# Patient Record
Sex: Male | Born: 1975 | Hispanic: No | Marital: Single | Smoking: Never smoker
Health system: Southern US, Community
[De-identification: ages and names within clinical notes are randomized; demographics above are authoritative.]

## PROBLEM LIST (undated history)

## (undated) DIAGNOSIS — G709 Myoneural disorder, unspecified: Secondary | ICD-10-CM

## (undated) DIAGNOSIS — F4310 Post-traumatic stress disorder, unspecified: Secondary | ICD-10-CM

## (undated) DIAGNOSIS — R519 Headache, unspecified: Secondary | ICD-10-CM

## (undated) DIAGNOSIS — F32A Depression, unspecified: Secondary | ICD-10-CM

## (undated) DIAGNOSIS — M259 Joint disorder, unspecified: Secondary | ICD-10-CM

## (undated) DIAGNOSIS — I499 Cardiac arrhythmia, unspecified: Secondary | ICD-10-CM

## (undated) DIAGNOSIS — F329 Major depressive disorder, single episode, unspecified: Secondary | ICD-10-CM

## (undated) DIAGNOSIS — G4700 Insomnia, unspecified: Secondary | ICD-10-CM

## (undated) DIAGNOSIS — R51 Headache: Secondary | ICD-10-CM

## (undated) DIAGNOSIS — F419 Anxiety disorder, unspecified: Secondary | ICD-10-CM

## (undated) HISTORY — DX: Major depressive disorder, single episode, unspecified: F32.9

## (undated) HISTORY — PX: NO PAST SURGERIES: SHX2092

## (undated) HISTORY — DX: Depression, unspecified: F32.A

## (undated) HISTORY — DX: Post-traumatic stress disorder, unspecified: F43.10

## (undated) HISTORY — DX: Cardiac arrhythmia, unspecified: I49.9

## (undated) HISTORY — DX: Anxiety disorder, unspecified: F41.9

## (undated) HISTORY — DX: Insomnia, unspecified: G47.00

## (undated) HISTORY — DX: Myoneural disorder, unspecified: G70.9

## (undated) HISTORY — DX: Joint disorder, unspecified: M25.9

## (undated) HISTORY — DX: Headache, unspecified: R51.9

## (undated) HISTORY — DX: Headache: R51

---

## 2011-07-05 ENCOUNTER — Ambulatory Visit: Payer: PRIVATE HEALTH INSURANCE | Admitting: Family Medicine

## 2011-07-05 VITALS — BP 105/71 | HR 78 | Temp 98.3°F | Resp 16 | Ht 70.0 in | Wt 217.8 lb

## 2011-07-05 DIAGNOSIS — H60399 Other infective otitis externa, unspecified ear: Secondary | ICD-10-CM

## 2011-07-05 DIAGNOSIS — H6092 Unspecified otitis externa, left ear: Secondary | ICD-10-CM

## 2011-07-05 MED ORDER — AMOXICILLIN 875 MG PO TABS: 875.0000 mg | ORAL_TABLET | Freq: Two times a day (BID) | ORAL | Status: AC

## 2011-07-05 MED ORDER — OFLOXACIN 0.3 % OT SOLN: OTIC | Status: DC

## 2011-07-05 NOTE — Progress Notes (Signed)
Subjective: Patient feels like a bug crawled in his ear last night. He is enjoying his gout.  Objective: Right TM is normal. Left TM is red on the anterior aspect of the drum anterior aspect of the canal. On the inferior aspect and posterior aspect of the canal more proximally there is a lot of erythema. There is a little black line in it it may be a rim of bug  Assessment: Otitis externa, possibly caused by having had a bug in ear canal but is no longer there.  Plan: Antibiotics and antibiotic ear drops. Return if not doing better.

## 2011-07-05 NOTE — Patient Instructions (Signed)
Note is present. Take the oral medications and use the drops.  Otitis Externa Otitis externa ("swimmer's ear") is a germ (bacterial) or fungal infection of the outer ear canal (from the eardrum to the outside of the ear). Swimming in dirty water may cause swimmer's ear. It also may be caused by moisture in the ear from water remaining after swimming or bathing. Often the first signs of infection may be itching in the ear canal. This may progress to ear canal swelling, redness, and pus drainage, which may be signs of infection. HOME CARE INSTRUCTIONS   Apply the antibiotic drops to the ear canal as prescribed by your doctor.   This can be a very painful medical condition. A strong pain reliever may be prescribed.   Only take over-the-counter or prescription medicines for pain, discomfort, or fever as directed by your caregiver.   If your caregiver has given you a follow-up appointment, it is very important to keep that appointment. Not keeping the appointment could result in a chronic or permanent injury, pain, hearing loss and disability. If there is any problem keeping the appointment, you must call back to this facility for assistance.  PREVENTION   It is important to keep your ear dry. Use the corner of a towel to wick water out of the ear canal after swimming or bathing.   Avoid scratching in your ear. This can damage the ear canal or remove the protective wax lining the canal and make it easier for germs (bacteria) or a fungus to grow.   You may use ear drops made of rubbing alcohol and vinegar after swimming to prevent future "swimmer's ear" infections. Make up a small bottle of equal parts white vinegar and alcohol. Put 3 or 4 drops into each ear after swimming.   Avoid swimming in lakes, polluted water, or poorly chlorinated pools.  SEEK MEDICAL CARE IF:   An oral temperature above 102 F (38.9 C) develops.   Your ear is still painful after 3 days and shows signs of getting worse  (redness, swelling, pain, or pus).  MAKE SURE YOU:   Understand these instructions.   Will watch your condition.   Will get help right away if you are not doing well or get worse.  Document Released: 04/07/2005 Document Revised: 03/27/2011 Document Reviewed: 11/12/2007 [facility] Patient Information 2012 [place], [place].

## 2012-03-25 ENCOUNTER — Ambulatory Visit: Payer: BC Managed Care – PPO | Admitting: Family Medicine

## 2012-03-25 ENCOUNTER — Ambulatory Visit: Payer: BC Managed Care – PPO

## 2012-03-25 VITALS — BP 105/80 | HR 76 | Temp 98.1°F | Resp 18 | Ht 70.0 in | Wt 227.0 lb

## 2012-03-25 DIAGNOSIS — R042 Hemoptysis: Secondary | ICD-10-CM

## 2012-03-25 DIAGNOSIS — J040 Acute laryngitis: Secondary | ICD-10-CM

## 2012-03-25 DIAGNOSIS — R05 Cough: Secondary | ICD-10-CM

## 2012-03-25 MED ORDER — AZITHROMYCIN 250 MG PO TABS: ORAL_TABLET | ORAL | Status: DC

## 2012-03-25 MED ORDER — MUCINEX DM 30-600 MG PO TB12: ORAL_TABLET | ORAL | Status: DC

## 2012-03-25 MED ORDER — FLUTICASONE PROPIONATE 50 MCG/ACT NA SUSP: 2.0000 | Freq: Every day | NASAL | Status: DC

## 2012-03-25 NOTE — Patient Instructions (Addendum)
1. Cough  DG Chest 2 View, Dextromethorphan-Guaifenesin (MUCINEX DM) 30-600 MG TB12  2. Laryngitis  fluticasone (FLONASE) 50 MCG/ACT nasal spray  3. Hemoptysis     Laryngitis At the top of your windpipe is your voice box. It is the source of your voice. Inside your voice box are 2 bands of muscles called vocal cords. When you breathe, your vocal cords are relaxed and open so that air can get into the lungs. When you decide to say something, these cords come together and vibrate. The sound from these vibrations goes into your throat and comes out through your mouth as sound. Laryngitis is an inflammation of the vocal cords that causes hoarseness, cough, loss of voice, sore throat, and dry throat. Laryngitis can be temporary (acute) or long-term (chronic). Most cases of acute laryngitis improve with time.Chronic laryngitis lasts for more than 3 weeks. CAUSES Laryngitis can often be related to excessive smoking, talking, or yelling, as well as inhalation of toxic fumes and allergies. Acute laryngitis is usually caused by a viral infection, vocal strain, measles or mumps, or bacterial infections. Chronic laryngitis is usually caused by vocal cord strain, vocal cord injury, postnasal drip, growths on the vocal cords, or acid reflux. SYMPTOMS   Cough.  Sore throat.  Dry throat. RISK FACTORS  Respiratory infections.  Exposure to irritating substances, such as cigarette smoke, excessive amounts of alcohol, stomach acids, and workplace chemicals.  Voice trauma, such as vocal cord injury from shouting or speaking too loud. DIAGNOSIS  Your cargiver will perform a physical exam. During the physical exam, your caregiver will examine your throat. The most common sign of laryngitis is hoarseness. Laryngoscopy may be necessary to confirm the diagnosis of this condition. This procedure allows your caregiver to look into the larynx. HOME CARE INSTRUCTIONS  Drink enough fluids to keep your urine clear or  pale yellow.  Rest until you no longer have symptoms or as directed by your caregiver.  Breathe in moist air.  Take all medicine as directed by your caregiver.  Do not smoke.  Talk as little as possible (this includes whispering).  Write on paper instead of talking until your voice is back to normal.  Follow up with your caregiver if your condition has not improved after 10 days. SEEK MEDICAL CARE IF:   You have trouble breathing.  You cough up blood.  You have persistent fever.  You have increasing pain.  You have difficulty swallowing. MAKE SURE YOU:  Understand these instructions.  Will watch your condition.  Will get help right away if you are not doing well or get worse. Document Released: 04/07/2005 Document Revised: 06/30/2011 Document Reviewed: 06/13/2010 [facility] Patient Information 2013 [place], [place].

## 2012-03-25 NOTE — Progress Notes (Signed)
[address]   [place], [place]  [address]   [id number]  Subjective:    Patient ID: Jon Powell, male    DOB: [birth year], 36 y.o.   MRN: [id number]  HPIThis 36 y.o. male presents for evaluation of laryngitis, scratchy throat.  Onset one week ago.  No fever/chills/sweats.  No headache.  No sore throat but +PND causing throat irritation.  No ear pain.  No rhinorrhea; no nasal congestion.  +PND.  +coughing; +sputum production bloody light green; coughed up blood every morning this week.  No SOB.  No v/d.  No medications for symptoms.  Teaches at [employer].  Laryngitis x 4 days.   Review of Systems  Constitutional: Negative for fever, chills, diaphoresis and fatigue.  HENT: Positive for postnasal drip. Negative for ear pain, congestion, sore throat, rhinorrhea, sneezing, drooling, voice change and ear discharge.   Respiratory: Positive for cough. Negative for shortness of breath, wheezing and stridor.   Gastrointestinal: Negative for nausea, vomiting and diarrhea.  Skin: Negative for rash.    No past medical history on file.  No past surgical history on file.  Prior to Admission medications   Not on File    No Known Allergies  History   Social History  . Marital Status: Single    Spouse Name: N/A    Number of Children: N/A  . Years of Education: N/A   Occupational History  . Not on file.   Social History Main Topics  . Smoking status: Never Smoker   . Smokeless tobacco: Not on file  . Alcohol Use: No  . Drug Use: No  . Sexually Active: Yes    Birth Control/ Protection: Condom   Other Topics Concern  . Not on file   Social History Narrative  . No narrative on file    No family history on file.     Objective:   Physical Exam  Nursing note and vitals reviewed. Constitutional: He is oriented to person, place, and time. He appears well-developed and well-nourished. No distress.  HENT:  Head: Normocephalic and atraumatic.  Right Ear: External ear normal.  Left Ear:  External ear normal.  Nose: Nose normal.  Mouth/Throat: Oropharynx is clear and moist and mucous membranes are normal. Uvula swelling present. No oropharyngeal exudate.  Eyes: Conjunctivae normal and EOM are normal. Pupils are equal, round, and reactive to light.  Neck: Normal range of motion. Neck supple. No thyromegaly present.  Cardiovascular: Normal rate, regular rhythm and normal heart sounds.  Exam reveals no gallop and no friction rub.   No murmur heard. Pulmonary/Chest: Effort normal and breath sounds normal. He has no wheezes. He has no rales.  Lymphadenopathy:    He has no cervical adenopathy.  Neurological: He is alert and oriented to person, place, and time.  Skin: Skin is warm and dry. No rash noted. He is not diaphoretic.  Psychiatric: He has a normal mood and affect. His behavior is normal. Judgment and thought content normal.   [facility] reading (PRIMARY) by  Dr. Katrinka Blazing. CXR:  NAD.      Assessment & Plan:   1. Cough  DG Chest 2 View, Dextromethorphan-Guaifenesin (MUCINEX DM) 30-600 MG TB12  2. Laryngitis  fluticasone (FLONASE) 50 MCG/ACT nasal spray  3. Hemoptysis        1.  Laryngitis:  New.  Consistent with viral syndrome. Recommend voice rest, fluids.  Rx for Mucinex DM, Flonase sent to pharmacy. If no improvement in 3-5 days, to fill Zpack for secondary  bacterial infection.   2.  Cough:  New. Associated with laryngitis. Consistent with viral process. Supportive care with Mucinex DM. 3.  Hemoptysis:  New.  CXR negative.  Likely secondary to viral syndrome.  Supportive care with fluids, Mucinex DM, Flonase.  If persists more than next three days, to fill Zithromax.  If persists beyond one week, RTC.  Low risk for other pathology.  Meds ordered this encounter  Medications  . Dextromethorphan-Guaifenesin (MUCINEX DM) 30-600 MG TB12    Sig: One tablet two times daily for cough, congestion    Dispense:  20 each    Refill:  0  . fluticasone (FLONASE) 50 MCG/ACT nasal spray     Sig: Place 2 sprays into the nose daily.    Dispense:  16 g    Refill:  6  . azithromycin (ZITHROMAX Z-PAK) 250 MG tablet    Sig: Two tablets daily x 1 day,then one tablet daily x 4 days    Dispense:  6 each    Refill:  0

## 2013-12-30 ENCOUNTER — Ambulatory Visit (INDEPENDENT_AMBULATORY_CARE_PROVIDER_SITE_OTHER): Admitting: Family Medicine

## 2013-12-30 VITALS — BP 100/70 | HR 83 | Temp 97.8°F | Resp 16 | Ht 70.5 in | Wt 226.0 lb

## 2013-12-30 DIAGNOSIS — L259 Unspecified contact dermatitis, unspecified cause: Secondary | ICD-10-CM

## 2013-12-30 MED ORDER — TRIAMCINOLONE ACETONIDE 0.1 % EX CREA: 1.0000 "application " | TOPICAL_CREAM | Freq: Two times a day (BID) | CUTANEOUS | Status: DC

## 2013-12-30 MED ORDER — PREDNISONE 20 MG PO TABS: ORAL_TABLET | ORAL | Status: DC

## 2013-12-30 NOTE — Patient Instructions (Signed)
Take over-the-counter or Zyrtec (cetirizine) one daily for itching. Take at bedtime.   Take the prednisone after breakfast 3 daily for 2 days, then 2 daily for 2 days, then one daily for 2 days, then one half daily for 4 days  Use the triamcinolone cream twice daily on affected areas  Wash well. Very hot water may cause you to itch more.  Return if further problems

## 2013-12-30 NOTE — Progress Notes (Signed)
Subjective: Patient has a scattered itchy rash which has come up over last weekend. He does a lot outdoors with the [employer]. He also has dogs that he goes outdoors a lot with in the grass. Does not know of any specific poison ivy exposure, but has been in the right count of places that he could have gotten some.  Objective: Scattered rash maculopapular on his upper and lower arms, posterior thigh, and chest wall.  Since: Contact dermatitis, presumably poison ivy he may have gone from his dogs.  Plan: Prednisone, triamcinolone, and Zyrtec  Return if further problems

## 2014-01-14 ENCOUNTER — Ambulatory Visit (INDEPENDENT_AMBULATORY_CARE_PROVIDER_SITE_OTHER): Admitting: Family Medicine

## 2014-01-14 VITALS — BP 116/70 | HR 79 | Temp 97.9°F | Resp 16 | Ht 70.0 in | Wt 228.0 lb

## 2014-01-14 DIAGNOSIS — R21 Rash and other nonspecific skin eruption: Secondary | ICD-10-CM

## 2014-01-14 LAB — POCT SKIN KOH: SKIN KOH, POC: NEGATIVE

## 2014-01-14 NOTE — Progress Notes (Signed)
Subjective: Patient is here for the rash again. It is persisted. He used some Prometrium and someone else gave him. The cortisones did not help. He still itches.  Objective:   Scattered pruritic rash on trunk and extremities. It is in clusters and some linear areas. It turned out that did not respond poison ivy.  Skin scraping was done and is negative  Punch biopsy. done on right abdominal wall. Sterile technique. 1% lidocaine with epinephrine. Specimen sent for pathology. Cautery done of the base of the lesion.

## 2014-01-14 NOTE — Patient Instructions (Signed)
Take some antihistamine for itching if necessary.  If you do not hear from me by Thursday or Friday of next week regarding the results of the biopsy call back and see if they are available yet.

## 2014-01-14 NOTE — Progress Notes (Signed)
   Subjective:    Patient ID: Jon Powell, male    DOB: 1975/11/06, 38 y.o.   MRN: [id number]  HPI    Review of Systems     Objective:   Physical Exam        Assessment & Plan:

## 2014-01-19 ENCOUNTER — Other Ambulatory Visit: Payer: Self-pay | Admitting: Family Medicine

## 2014-01-19 ENCOUNTER — Telehealth: Payer: Self-pay | Admitting: Radiology

## 2014-01-19 DIAGNOSIS — L309 Dermatitis, unspecified: Secondary | ICD-10-CM

## 2014-01-19 MED ORDER — CLOBETASOL PROPIONATE 0.05 % EX CREA: 1.0000 "application " | TOPICAL_CREAM | Freq: Two times a day (BID) | CUTANEOUS | Status: DC

## 2014-01-19 NOTE — Telephone Encounter (Signed)
Pt is calling repeatedly asking about his path results.  i don't think they're back yet, but I wanted to let you know.

## 2014-01-20 NOTE — Telephone Encounter (Signed)
Note sent to lab about this last night

## 2014-01-31 ENCOUNTER — Ambulatory Visit (INDEPENDENT_AMBULATORY_CARE_PROVIDER_SITE_OTHER): Admitting: Physician Assistant

## 2014-01-31 VITALS — BP 112/80 | HR 76 | Temp 98.3°F | Resp 16 | Ht 70.25 in | Wt 230.0 lb

## 2014-01-31 DIAGNOSIS — L309 Dermatitis, unspecified: Secondary | ICD-10-CM

## 2014-01-31 MED ORDER — PREDNISONE 20 MG PO TABS: ORAL_TABLET | ORAL | Status: AC

## 2014-01-31 MED ORDER — CETIRIZINE HCL 10 MG PO TABS: 10.0000 mg | ORAL_TABLET | Freq: Every day | ORAL | Status: DC

## 2014-01-31 MED ORDER — HYDROXYZINE HCL 25 MG PO TABS: 12.5000 mg | ORAL_TABLET | Freq: Four times a day (QID) | ORAL | Status: DC | PRN

## 2014-01-31 MED ORDER — RANITIDINE HCL 150 MG PO TABS: 150.0000 mg | ORAL_TABLET | Freq: Two times a day (BID) | ORAL | Status: DC

## 2014-01-31 NOTE — Progress Notes (Signed)
I was directly involved with the patient's care and agree with the physical, diagnosis and treatment plan.  

## 2014-01-31 NOTE — Patient Instructions (Signed)
Limit showers to once a day Stop using [place] body wash. Start using dove soap. Make sure Detergents are dye free and scent free Moisturize twice a day. Keep taking medications for at least 5 days after your itching stops. Return if symptoms fail to worsen.  Eczema Eczema, also called atopic dermatitis, is a skin disorder that causes inflammation of the skin. It causes a red rash and dry, scaly skin. The skin becomes very itchy. Eczema is generally worse during the cooler winter months and often improves with the warmth of summer. Eczema usually starts showing signs in infancy. Some children outgrow eczema, but it may last through adulthood.  CAUSES  The exact cause of eczema is not known, but it appears to run in families. People with eczema often have a family history of eczema, allergies, asthma, or hay fever. Eczema is not contagious. Flare-ups of the condition may be caused by:   Contact with something you are sensitive or allergic to.   Stress. SIGNS AND SYMPTOMS  Dry, scaly skin.   Red, itchy rash.   Itchiness. This may occur before the skin rash and may be very intense.  DIAGNOSIS  The diagnosis of eczema is usually made based on symptoms and medical history. TREATMENT  Eczema cannot be cured, but symptoms usually can be controlled with treatment and other strategies. A treatment plan might include:  Controlling the itching and scratching.   Use over-the-counter antihistamines as directed for itching. This is especially useful at night when the itching tends to be worse.   Use over-the-counter steroid creams as directed for itching.   Avoid scratching. Scratching makes the rash and itching worse. It may also result in a skin infection (impetigo) due to a break in the skin caused by scratching.   Keeping the skin well moisturized with creams every day. This will seal in moisture and help prevent dryness. Lotions that contain alcohol and water should be  avoided because they can dry the skin.   Limiting exposure to things that you are sensitive or allergic to (allergens).   Recognizing situations that cause stress.   Developing a plan to manage stress.  HOME CARE INSTRUCTIONS   Only take over-the-counter or prescription medicines as directed by your health care provider.   Do not use anything on the skin without checking with your health care provider.   Keep baths or showers short (5 minutes) in warm (not hot) water. Use mild cleansers for bathing. These should be unscented. You may add nonperfumed bath oil to the bath water. It is best to avoid soap and bubble bath.   Immediately after a bath or shower, when the skin is still damp, apply a moisturizing ointment to the entire body. This ointment should be a petroleum ointment. This will seal in moisture and help prevent dryness. The thicker the ointment, the better. These should be unscented.   Keep fingernails cut short. Children with eczema may need to wear soft gloves or mittens at night after applying an ointment.   Dress in clothes made of cotton or cotton blends. Dress lightly, because heat increases itching.   A child with eczema should stay away from anyone with fever blisters or cold sores. The virus that causes fever blisters (herpes simplex) can cause a serious skin infection in children with eczema. SEEK MEDICAL CARE IF:   Your itching interferes with sleep.   Your rash gets worse or is not better within 1 week after starting treatment.   You see  pus or soft yellow scabs in the rash area.   You have a fever.   You have a rash flare-up after contact with someone who has fever blisters.  Document Released: 04/04/2000 Document Revised: 01/26/2013 Document Reviewed: 11/08/2012 [facility] Patient Information 2015 [place]. This information is not intended to replace advice given to you by your health care provider. Make sure you discuss any questions you  have with your health care provider.

## 2014-01-31 NOTE — Progress Notes (Signed)
Subjective:    Patient ID: Jon Powell, male    DOB: 05/12/1975, 38 y.o.   MRN: [id number]  HPI  This is a 38 year old male presenting with 4-5 weeks of a pruritic rash on his chest, abdomen, arms and legs. He has been seen twice already. It was originally thought that he had poison ivy and was prescribed a tapering dose of prednisone. Patient reports this did not help and returned. A spot on his abdomen was biopsied and it was thought he had eczema. Biopsy showed eosinophils and resembled contact dermatitis. At this point he was prescribed topical clobetasol 0.05%. Patient used the clobetasol for 1.5 weeks. He returns today stating that the cream helped the rash on his chest and abdomen but has not improved the rash on his arms and legs. He states the only thing that makes it feel better is scratching off the surface of the lesion. Patient reports he did not have atopy as a child and never had asthma or seasonal allergies. He uses cocoa butter lotion once a day after he showers. He showers 1-2 times per day. He uses [place] body wash. He has not changed detergents.   Review of Systems  Constitutional: Negative.   HENT: Negative.   Eyes: Negative.   Respiratory: Negative.   Skin: Positive for rash.  Allergic/Immunologic: Negative.        Objective:   Physical Exam  Constitutional: He is oriented to person, place, and time. He appears well-developed and well-nourished. No distress.  HENT:  Head: Normocephalic and atraumatic.  Right Ear: Hearing normal.  Left Ear: Hearing normal.  Eyes: Lids are normal. Right eye exhibits no discharge. Left eye exhibits no discharge.  Pulmonary/Chest: Effort normal. No respiratory distress.  Neurological: He is alert and oriented to person, place, and time.  Skin: Skin is warm and dry. No rash (lesions on upper arms and chest are flesh colored, raised and scabbed. lesions appear old. ) noted. Rash is not papular. He is not diaphoretic.  Psychiatric:  He has a normal mood and affect. His speech is normal and behavior is normal. Thought content normal.       Assessment & Plan:  1. Dermatitis  Patient likely has a contact dermatitis, as supported by a skin biopsy a few weeks ago. There was no improvement with a prednisone taper and there was only some improvement with the clobetasol cream. At this point, as demonstrated by scabbed old lesions, his rash is most likely caused by continued scratching of the lesions. He will discontinue the clobetasol cream. He was start another prednisone taper as well as atarax, zantac and zyrtec. We discussed limiting showers to once a day and moisturizing twice a day. He will stop using [place] body wash and start using dove soap. We discussed he may need to use a hypoallergenic detergent in the future. He will try all of these things and come back if not improved. If not improved, will refer to dermatology.  - hydrOXYzine (ATARAX/VISTARIL) 25 MG tablet; Take 0.5-1 tablets (12.5-25 mg total) by mouth every 6 (six) hours as needed for itching.  Dispense: 60 tablet; Refill: 0 - predniSONE (DELTASONE) 20 MG tablet; Take 3 PO QAM x3days, 2 PO QAM x3days, 1 PO QAM x3days  Dispense: 18 tablet; Refill: 0 - ranitidine (ZANTAC) 150 MG tablet; Take 1 tablet (150 mg total) by mouth 2 (two) times daily.  Dispense: 60 tablet; Refill: 0 - cetirizine (ZYRTEC) 10 MG tablet; Take 1 tablet (  10 mg total) by mouth daily.  Dispense: 30 tablet; Refill: 11   Nicole V. Dyke BrackettBush, PA-C, M[facility]  01/31/2014

## 2014-02-20 ENCOUNTER — Other Ambulatory Visit: Payer: Self-pay | Admitting: Family Medicine

## 2014-02-21 ENCOUNTER — Other Ambulatory Visit: Payer: Self-pay | Admitting: Family Medicine

## 2014-02-22 NOTE — Telephone Encounter (Signed)
Dr Alwyn RenHopper, I got this request yesterday and denied it bc Joni Reiningicole had DCd use at 01/31/14 OV, but it appears pt is requesting it again. Do you want to RF it, or have pt RTC again?

## 2014-02-23 ENCOUNTER — Ambulatory Visit (INDEPENDENT_AMBULATORY_CARE_PROVIDER_SITE_OTHER): Admitting: Family Medicine

## 2014-02-23 VITALS — BP 100/70 | HR 74 | Temp 98.4°F | Resp 16 | Ht 70.0 in | Wt 229.4 lb

## 2014-02-23 DIAGNOSIS — L0100 Impetigo, unspecified: Secondary | ICD-10-CM

## 2014-02-23 DIAGNOSIS — L309 Dermatitis, unspecified: Secondary | ICD-10-CM

## 2014-02-23 DIAGNOSIS — L011 Impetiginization of other dermatoses: Secondary | ICD-10-CM

## 2014-02-23 MED ORDER — TRIAMCINOLONE ACETONIDE 0.1 % EX CREA: 1.0000 "application " | TOPICAL_CREAM | Freq: Three times a day (TID) | CUTANEOUS | Status: DC

## 2014-02-23 MED ORDER — CLOBETASOL PROPIONATE 0.05 % EX CREA: 1.0000 "application " | TOPICAL_CREAM | Freq: Two times a day (BID) | CUTANEOUS | Status: DC

## 2014-02-23 NOTE — Patient Instructions (Signed)
As soon as the rash resolves w/ clobetasol switch to triamcinolone and Eucerin mixture to keep the rash away.  If you are not having any further problems, you can cancel your dermatology appointment.  Eczema Eczema, also called atopic dermatitis, is a skin disorder that causes inflammation of the skin. It causes a red rash and dry, scaly skin. The skin becomes very itchy. Eczema is generally worse during the cooler winter months and often improves with the warmth of summer. Eczema usually starts showing signs in infancy. Some children outgrow eczema, but it may last through adulthood.  CAUSES  The exact cause of eczema is not known, but it appears to run in families. People with eczema often have a family history of eczema, allergies, asthma, or hay fever. Eczema is not contagious. Flare-ups of the condition may be caused by:   Contact with something you are sensitive or allergic to.   Stress. SIGNS AND SYMPTOMS  Dry, scaly skin.   Red, itchy rash.   Itchiness. This may occur before the skin rash and may be very intense.  DIAGNOSIS  The diagnosis of eczema is usually made based on symptoms and medical history. TREATMENT  Eczema cannot be cured, but symptoms usually can be controlled with treatment and other strategies. A treatment plan might include:  Controlling the itching and scratching.   Use over-the-counter antihistamines as directed for itching. This is especially useful at night when the itching tends to be worse.   Use over-the-counter steroid creams as directed for itching.   Avoid scratching. Scratching makes the rash and itching worse. It may also result in a skin infection (impetigo) due to a break in the skin caused by scratching.   Keeping the skin well moisturized with creams every day. This will seal in moisture and help prevent dryness. Lotions that contain alcohol and water should be avoided because they can dry the skin.   Limiting exposure to things that  you are sensitive or allergic to (allergens).   Recognizing situations that cause stress.   Developing a plan to manage stress.  HOME CARE INSTRUCTIONS   Only take over-the-counter or prescription medicines as directed by your health care provider.   Do not use anything on the skin without checking with your health care provider.   Keep baths or showers short (5 minutes) in warm (not hot) water. Use mild cleansers for bathing. These should be unscented. You may add nonperfumed bath oil to the bath water. It is best to avoid soap and bubble bath.   Immediately after a bath or shower, when the skin is still damp, apply a moisturizing ointment to the entire body. This ointment should be a petroleum ointment. This will seal in moisture and help prevent dryness. The thicker the ointment, the better. These should be unscented.   Keep fingernails cut short. Children with eczema may need to wear soft gloves or mittens at night after applying an ointment.   Dress in clothes made of cotton or cotton blends. Dress lightly, because heat increases itching.   A child with eczema should stay away from anyone with fever blisters or cold sores. The virus that causes fever blisters (herpes simplex) can cause a serious skin infection in children with eczema. SEEK MEDICAL CARE IF:   Your itching interferes with sleep.   Your rash gets worse or is not better within 1 week after starting treatment.   You see pus or soft yellow scabs in the rash area.   You have  a fever.   You have a rash flare-up after contact with someone who has fever blisters.  Document Released: 04/04/2000 Document Revised: 01/26/2013 Document Reviewed: 11/08/2012 [facility] Patient Information 2015 [employer] [place]. This information is not intended to replace advice given to you by your health care provider. Make sure you discuss any questions you have with your health care provider.

## 2014-02-23 NOTE — Progress Notes (Signed)
Subjective:    Patient ID: Jon Powell, male    DOB: 01/18/1976, 38 y.o.   MRN: [id number] This chart was scribed for Norberto SorensonEva Gaye Scorza, MD by Jolene Provostobert Halas, Medical Scribe. This patient was seen in Room 11 and the patient's care was started a 11:58 AM.  Chief Complaint  Patient presents with  . Follow-up    here for follow up on rash    HPI   History reviewed. No pertinent past medical history. Allergies  Allergen Reactions  . Doxycycline Rash   No current outpatient prescriptions on file prior to visit.   No current facility-administered medications on file prior to visit.   HPI Comments: Jon Powell is a 38 y.o. male who [facility] of continuing rash that began two months ago. Pt stopped taking clobetasol two weeks ago after relief of his rash symptoms on his arms bilaterally and posterior thigh, as well as an improvement of his rash on his chest. Five days ago his rash on his chest recurred. Pt has not seen a dermatologist, and would like a recommendation.  Pt has had a rash for two months, and [facility] for the same symptoms. Pt was seen on (12/30/2013) and was prescribed a prednisone taper, triamcinolone cream (TAC) and zyrtec. Pt was then seen two weeks (01/14/2014) later without improvement. Biopsy was taken at this point, which showed eczema. Pt was prescribed clobatizol at that point. Pt's rash on chest and abdomen reponded to th clobatizol , but the rash on his chest expanded. Pt was then seen on 01/31/2014 and was put on another prenisone taper, in addition to atarax and zantac. Pt was also encouraged to change soap and increase skin moisturizer.   Review of Systems  Constitutional: Negative for fever and chills.  HENT: Negative for rhinorrhea.   Skin: Positive for color change and rash. Negative for wound.       Objective:  BP 100/70 mmHg  Pulse 74  Temp(Src) 98.4 F (36.9 C)  Resp 16  Ht 5\' 10"  (1.778 m)  Wt 229 lb  6.4 oz (104.055 kg)  BMI 32.92 kg/m2  SpO2 98%  Physical Exam  Constitutional: He is oriented to person, place, and time. He appears well-developed and well-nourished.  HENT:  Head: Normocephalic and atraumatic.  Eyes: Pupils are equal, round, and reactive to light.  Neck: No JVD present.  Cardiovascular: Normal rate and regular rhythm.   Pulmonary/Chest: Effort normal and breath sounds normal. No respiratory distress.  Neurological: He is alert and oriented to person, place, and time.  Skin: Skin is warm and dry.  2mm hyperpigmented macules with poorly defined serpiginous hyper pigmented macular papular lesions, approximately 2cm.  Psychiatric: He has a normal mood and affect. His behavior is normal.  Nursing note and vitals reviewed.      Assessment & Plan:   Impetiginized atopic dermatitis - Plan: Ambulatory referral to Dermatology  Eczema - Plan: clobetasol cream (TEMOVATE) 0.05 %, Ambulatory referral to Dermatology  Meds ordered this encounter  Medications  . clobetasol cream (TEMOVATE) 0.05 %    Sig: Apply 1 application topically 2 (two) times daily.    Dispense:  60 g    Refill:  1  . triamcinolone cream (KENALOG) 0.1 %    Sig: Apply 1 application topically 3 (three) times daily.    Dispense:  453.6 g    Refill:  5    Mix 1:2 ratio with TAC: Eucerin. Dispense large jar  I personally performed the services described in this documentation, which was scribed in my presence. The recorded information has been reviewed and considered, and addended by me as needed.  Delman Cheadle, MD MPH

## 2014-02-27 NOTE — Telephone Encounter (Signed)
Patient already saw Dr. Clelia CroftShaw.

## 2014-04-11 ENCOUNTER — Ambulatory Visit (INDEPENDENT_AMBULATORY_CARE_PROVIDER_SITE_OTHER)

## 2014-04-11 ENCOUNTER — Ambulatory Visit (INDEPENDENT_AMBULATORY_CARE_PROVIDER_SITE_OTHER): Admitting: Emergency Medicine

## 2014-04-11 VITALS — BP 106/76 | HR 76 | Temp 98.5°F | Resp 18 | Ht 70.5 in | Wt 224.6 lb

## 2014-04-11 DIAGNOSIS — R059 Cough, unspecified: Secondary | ICD-10-CM

## 2014-04-11 DIAGNOSIS — R05 Cough: Secondary | ICD-10-CM

## 2014-04-11 DIAGNOSIS — M542 Cervicalgia: Secondary | ICD-10-CM

## 2014-04-11 DIAGNOSIS — M545 Low back pain, unspecified: Secondary | ICD-10-CM

## 2014-04-11 DIAGNOSIS — M5412 Radiculopathy, cervical region: Secondary | ICD-10-CM

## 2014-04-11 DIAGNOSIS — M62838 Other muscle spasm: Secondary | ICD-10-CM

## 2014-04-11 DIAGNOSIS — G43009 Migraine without aura, not intractable, without status migrainosus: Secondary | ICD-10-CM

## 2014-04-11 DIAGNOSIS — R053 Chronic cough: Secondary | ICD-10-CM

## 2014-04-11 MED ORDER — MELOXICAM 7.5 MG PO TABS: 7.5000 mg | ORAL_TABLET | Freq: Every day | ORAL | Status: DC

## 2014-04-11 MED ORDER — CYCLOBENZAPRINE HCL 10 MG PO TABS: 10.0000 mg | ORAL_TABLET | Freq: Every day | ORAL | Status: DC

## 2014-04-11 MED ORDER — GUAIFENESIN ER 1200 MG PO TB12: 1.0000 | ORAL_TABLET | Freq: Two times a day (BID) | ORAL | Status: DC | PRN

## 2014-04-11 MED ORDER — TOPIRAMATE 50 MG PO TABS: 50.0000 mg | ORAL_TABLET | Freq: Two times a day (BID) | ORAL | Status: DC

## 2014-04-11 MED ORDER — HYDROCODONE-HOMATROPINE 5-1.5 MG/5ML PO SYRP: 5.0000 mL | ORAL_SOLUTION | Freq: Every evening | ORAL | Status: DC | PRN

## 2014-04-11 MED ORDER — SUMATRIPTAN SUCCINATE 50 MG PO TABS: 50.0000 mg | ORAL_TABLET | ORAL | Status: AC | PRN

## 2014-04-11 MED ORDER — BENZONATATE 100 MG PO CAPS: 100.0000 mg | ORAL_CAPSULE | Freq: Three times a day (TID) | ORAL | Status: DC | PRN

## 2014-04-11 NOTE — Patient Instructions (Signed)

## 2014-04-11 NOTE — Progress Notes (Signed)
MRN: [id number] DOB: 07/12/1975  Subjective:   Jon Powell is a 38 y.o. male presenting for respiratory problems, migraines and "degenerative joint disease".  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 Since he returned, has had chronic sinus issues, respiratory problems. Today reports 5 week history of cough intermittently productive with light green mucus, worse at night, wakes him up from sleep intermittent post-tussive emesis. Associated with frontal sinus pain, ear fullness, raw throat, lost his voice a couple of weeks ago (now improved), chest congestion, chest tightness, intermittent shob, intermittent wheezing at night. Also admits history of night sweats and weight loss. Denies fevers, ear pain, itchy watery or red eyes. Has a history of seasonal allergies as a child. Denies family history of lung disease, sarcoidosis. Denies history of asthma or GERD. Denies smoking, 1-2 alcohol drinks a week. No other drug use.   Denies any other aggravating or relieving factors, no other questions or concerns.  Migraines - headaches occur every day. Headaches are usually on left side, can be sharp or dull/achy, feels tension "behind his eyes", photophobia, phonophobia, has occasional nausea and vomiting with headaches, some dizziness. Admits that 3-4x a week, h/a's are debilitating, has to lay down, turn the lights off, uses ear plugs for 5-8 hours before he is okay again. He has also tried using Alleve and Excedrin for migraines without any relief. Denies scotomas, double vision, numbness, tingling. Of note, patient works at [employer], teaches music and marching band.  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 Today reports daily neck pain, sharp in nature, shooting pain with n/t in forearm and hands bilaterally, reports decreased ROM. Also, reports low back pain since 12/2013,  precipitated from bending down to pick up band equipment. Since then has nagging achy back pain, non-radiating, worse with standing or sitting for prolonged periods. Has tried Alleve stretching to help his back and neck with no relief. No history of surgeries or trauma.   Jon Powell has a current medication list which includes the following prescription(s): clobetasol cream and triamcinolone cream. He is allergic to doxycycline.  Jon Powell  has a past medical history of Neuromuscular disorder and Joint disorder. Also  has no past surgical history on file.  ROS As in subjective.  Objective:   Vitals: BP 106/76 mmHg  Pulse 76  Temp(Src) 98.5 F (36.9 C) (Oral)  Resp 18  Ht 5' 10.5" (1.791 m)  Wt 224 lb 9.6 oz (101.878 kg)  BMI 31.76 kg/m2  SpO2 98%  Wt Readings from Last 3 Encounters:  04/11/14 224 lb 9.6 oz (101.878 kg)  02/23/14 229 lb 6.4 oz (104.055 kg)  01/31/14 230 lb (104.327 kg)   Physical Exam  Constitutional: He is well-developed, well-nourished, and in no distress.  HENT:  TM's pearly and intact bilaterally, no effusions or erythema. Nasal turbinates pink and moist, rhinorrhea clear-yellow. Mild postnasal drip present, without oropharyngeal exudates.  Eyes: Conjunctivae and EOM are normal. Right eye exhibits no discharge. Left eye exhibits no discharge. No scleral icterus.  Cardiovascular: Normal rate, regular rhythm, normal heart sounds and intact distal pulses.  Exam reveals no gallop and no friction rub.   No murmur heard. Pulmonary/Chest: Effort normal and breath sounds normal. No respiratory distress. He has no wheezes. He has no rales. He exhibits no tenderness.  Abdominal: Bowel sounds are normal.  Musculoskeletal:  Tenderness, spasms over trapezius. Limited flexion and extension for neck, radicular pain left arm and hand on Spurling maneuver. Tenderness over paraspinal muscles in lumbar region. Good ROM for back. Negative SLR.  Strength 5/5, sensation intact.    Skin: Skin is warm and dry. No rash noted. He is not diaphoretic. No erythema.  Psychiatric: Mood and affect normal.   [facility] reading (PRIMARY) by  Dr. Dareen PianoAnderson and PA-Jilene Spohr. Cervical spine: loss of normal lordotic curvature of c-spine otherwise no evidence of fracture, dislocation, normal joint space, soft tissue unremarkable. Lumbar spine: no evidence of fracture, dislocation, normal joint space, soft tissue unremarkable. CXR: No acute osseous findings or active cardiopulmonary process demonstrated.  Assessment and Plan :   1. Chronic cough - CXR reassuring for r/o infectious process - Symptomatic treatment with Hycodan, Tessalon, Mucinex - Referral to pulmonology for further evaluation  2. Neck pain 3. Cervical radicular pain 4. Midline low back pain without sciatica 5. Spasm of muscle - Neck x-ray shows mild loss of lordotic curvature of spine, spasms on exam, back x-ray reassuring - Will treat symptomatically with Mobic and Flexeril, advised to call back in 2-3 weeks if no improvement or worsening symptoms, consider referral to ortho  6. Migraine without aura and without status migrainosus, not intractable - Topomax for prevention and Imitrex for abortive treatment of migraines - Ambulatory referral to Neurology for further evaluation   Wallis BambergMario Neyda Durango, PA-C [facility] [phone] 04/11/2014 4:59 PM

## 2014-04-19 ENCOUNTER — Ambulatory Visit (INDEPENDENT_AMBULATORY_CARE_PROVIDER_SITE_OTHER): Admitting: Diagnostic Neuroimaging

## 2014-04-19 ENCOUNTER — Encounter: Payer: Self-pay | Admitting: Diagnostic Neuroimaging

## 2014-04-19 VITALS — BP 112/76 | HR 72 | Temp 97.2°F | Ht 70.5 in | Wt 234.4 lb

## 2014-04-19 DIAGNOSIS — G43109 Migraine with aura, not intractable, without status migrainosus: Secondary | ICD-10-CM

## 2014-04-19 DIAGNOSIS — F4310 Post-traumatic stress disorder, unspecified: Secondary | ICD-10-CM

## 2014-04-19 DIAGNOSIS — G4700 Insomnia, unspecified: Secondary | ICD-10-CM

## 2014-04-19 NOTE — Progress Notes (Signed)
[facility]  PATIENT: Jon Powell DOB: 09/30/1975  REFERRING CLINICIAN: Mani HISTORY FROM: patient  REASON FOR VISIT: new consult    HISTORICAL  CHIEF COMPLAINT:  Chief Complaint  Patient presents with  . Migraine    HISTORY OF PRESENT ILLNESS:   38 year old right-handed male here for evaluation of headaches. 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 When he returned, one year later he developed severe depression, anxiety and PTSD symptoms.  Patient has had difficulty with sleep, averaging or a 3-4 hours per night. Around this time he began to develop new type of headaches, starting with fatigue, neck pain, ringing in ears, blurred vision, tunnel vision, followed by sharp ice pick stabbing pain in the left side of his head, sometimes on the top and sometimes on the back. Headaches are associated with nausea, vomiting, presyncope feeling, photophobia and phonophobia. Headaches can last 2-4 hours at a time, sometimes lasting until the next day. He has these headaches 1-3 times per week. No specific triggering factors. Patient usually takes Aleve, Excedrin, lays down in a dark quiet room, and sometimes uses a cool washcloth on his head.   Patient has been prescribed topiramate and sumatriptan by urgent care, and he has filled these medications but has not tried them yet.  Patient does not have neuroimaging studies of the brain.  No family history of migraine. No similar headaches in the past.  Patient has seen psychiatrists last year who recommended outpatient therapy/counseling sessions, but patient has not pursued this. Patient is reluctant to take medications for depression anxiety.   REVIEW OF SYSTEMS: Full 14 system review of systems performed and notable only for as per history of present illness otherwise negative.  ALLERGIES: Allergies  Allergen Reactions  . Doxycycline Rash    HOME MEDICATIONS: Outpatient Prescriptions  Prior to Visit  Medication Sig Dispense Refill  . benzonatate (TESSALON) 100 MG capsule Take 1-2 capsules (100-200 mg total) by mouth 3 (three) times daily as needed for cough. (Patient not taking: Reported on 04/19/2014) 40 capsule 0  . clobetasol cream (TEMOVATE) 0.05 % Apply 1 application topically 2 (two) times daily. (Patient not taking: Reported on 04/19/2014) 60 g 1  . cyclobenzaprine (FLEXERIL) 10 MG tablet Take 1 tablet (10 mg total) by mouth at bedtime. (Patient not taking: Reported on 04/19/2014) 30 tablet 0  . Guaifenesin (MUCINEX MAXIMUM STRENGTH) 1200 MG TB12 Take 1 tablet (1,200 mg total) by mouth every 12 (twelve) hours as needed. (Patient not taking: Reported on 04/19/2014) 14 tablet 1  . HYDROcodone-homatropine (HYCODAN) 5-1.5 MG/5ML syrup Take 5 mLs by mouth at bedtime as needed for cough. (Patient not taking: Reported on 04/19/2014) 120 mL 0  . meloxicam (MOBIC) 7.5 MG tablet Take 1 tablet (7.5 mg total) by mouth daily. (Patient not taking: Reported on 04/19/2014) 30 tablet 0  . SUMAtriptan (IMITREX) 50 MG tablet Take 1 tablet (50 mg total) by mouth as needed for migraine or headache. May repeat in 2 hours if headache persists or recurs. (Patient not taking: Reported on 04/19/2014) 10 tablet 0  . topiramate (TOPAMAX) 50 MG tablet Take 1 tablet (50 mg total) by mouth 2 (two) times daily. (Patient not taking: Reported on 04/19/2014) 60 tablet 1  . triamcinolone cream (KENALOG) 0.1 % Apply 1 application topically 3 (three) times daily. (Patient not taking: Reported on 04/19/2014) 453.6 g 5   No facility-administered medications prior to visit.    PAST MEDICAL HISTORY: Past Medical History  Diagnosis Date  . Neuromuscular disorder   . Joint disorder   . Insomnia   . Depression   . Anxiety     PAST SURGICAL HISTORY: Past Surgical History  Procedure Laterality Date  . No past surgeries      FAMILY HISTORY: History reviewed. No pertinent family history.  SOCIAL  HISTORY:  History   Social History  . Marital Status: Single    Spouse Name: 0    Number of Children: N/A  . Years of Education: PhD   Occupational History  .  [employer]   Social History Main Topics  . Smoking status: Never Smoker   . Smokeless tobacco: Never Used  . Alcohol Use: No     Comment: 1-2 drinks weekly  . Drug Use: No  . Sexual Activity: Yes    Birth Control/ Protection: Condom   Other Topics Concern  . Not on file   Social History Narrative   Patient lives at home alone.   Caffeine Use: none     PHYSICAL EXAM  Filed Vitals:   04/19/14 0802  BP: 112/76  Pulse: 72  Temp: 97.2 F (36.2 C)  TempSrc: Oral  Height: 5' 10.5" (1.791 m)  Weight: 234 lb 6.4 oz (106.323 kg)    Body mass index is 33.15 kg/(m^2).   Visual Acuity Screening   Right eye Left eye Both eyes  Without correction: 20/30 20/30   With correction:       No flowsheet data found.  GENERAL EXAM: Patient is in no distress; well developed, nourished and groomed; neck is supple  CARDIOVASCULAR: Regular rate and rhythm, no murmurs, no carotid bruits  NEUROLOGIC: MENTAL STATUS: awake, alert, oriented to person, place and time, recent and remote memory intact, normal attention and concentration, language fluent, comprehension intact, naming intact, fund of knowledge appropriate CRANIAL NERVE: no papilledema on fundoscopic exam, pupils equal and reactive to light, visual fields full to confrontation, extraocular muscles intact, no nystagmus, facial sensation and strength symmetric, hearing intact, palate elevates symmetrically, uvula midline, shoulder shrug symmetric, tongue midline. MOTOR: normal bulk and tone, full strength in the BUE, BLE SENSORY: normal and symmetric to light touch, pinprick, temperature, vibration COORDINATION: finger-nose-finger, fine finger movements normal REFLEXES: deep tendon reflexes present and symmetric GAIT/STATION: narrow based gait; romberg is  negative    DIAGNOSTIC DATA (LABS, IMAGING, TESTING) - I reviewed patient records, labs, notes, testing and imaging myself where available.  No results found for: WBC, HGB, HCT, MCV, PLT No results found for: NA, K, CL, CO2, GLUCOSE, BUN, CREATININE, CALCIUM, PROT, ALBUMIN, AST, ALT, ALKPHOS, BILITOT, GFRNONAA, GFRAA No results found for: CHOL, HDL, LDLCALC, LDLDIRECT, TRIG, CHOLHDL No results found for: WGNF6OHGBA1C No results found for: VITAMINB12 No results found for: TSH     ASSESSMENT AND PLAN  38 y.o. year old male here with new-onset headaches since 2011, in setting of PTSD, depression, anxiety, insomnia. Headaches have migraine features. However patient has never had these headaches before. We'll check neuroimaging study to rule out secondary causes. Agree with topiramate and sumatriptan.  PLAN: - MRI brain - TPX + sumatriptan prn  Orders Placed This Encounter  Procedures  . MR Brain Wo Contrast   Return in about 6 weeks (around 05/31/2014).    Suanne MarkerVIKRAM R. Deyona Soza, MD 04/19/2014, 8:58 AM Certified in Neurology, Neurophysiology and Neuroimaging  [facility] [address], [address] [place], [place] [address] [phone]

## 2014-04-19 NOTE — Patient Instructions (Signed)
Start topiramate for migraine prevention.  Use sumatriptan as needed for breakthrough migraine.

## 2014-04-26 ENCOUNTER — Other Ambulatory Visit (INDEPENDENT_AMBULATORY_CARE_PROVIDER_SITE_OTHER)

## 2014-04-26 ENCOUNTER — Ambulatory Visit (INDEPENDENT_AMBULATORY_CARE_PROVIDER_SITE_OTHER): Admitting: Internal Medicine

## 2014-04-26 ENCOUNTER — Ambulatory Visit (INDEPENDENT_AMBULATORY_CARE_PROVIDER_SITE_OTHER)
Admission: RE | Admit: 2014-04-26 | Discharge: 2014-04-26 | Disposition: A | Discharge status: Home / Self Care | Source: Ambulatory Visit | Attending: Internal Medicine | Admitting: Internal Medicine

## 2014-04-26 ENCOUNTER — Encounter: Payer: Self-pay | Admitting: Internal Medicine

## 2014-04-26 VITALS — BP 122/84 | HR 102 | Temp 97.9°F | Ht 70.5 in | Wt 235.8 lb

## 2014-04-26 DIAGNOSIS — R058 Other specified cough: Secondary | ICD-10-CM

## 2014-04-26 DIAGNOSIS — R05 Cough: Secondary | ICD-10-CM

## 2014-04-26 DIAGNOSIS — J45991 Cough variant asthma: Secondary | ICD-10-CM | POA: Insufficient documentation

## 2014-04-26 LAB — CBC WITH DIFFERENTIAL/PLATELET
BASOS ABS: 0 10*3/uL (ref 0.0–0.1)
BASOS PCT: 0.4 % (ref 0.0–3.0)
Eosinophils Absolute: 0.1 10*3/uL (ref 0.0–0.7)
Eosinophils Relative: 1.1 % (ref 0.0–5.0)
HEMATOCRIT: 47.4 % (ref 39.0–52.0)
Hemoglobin: 15.5 g/dL (ref 13.0–17.0)
Lymphocytes Relative: 23.7 % (ref 12.0–46.0)
Lymphs Abs: 2.1 10*3/uL (ref 0.7–4.0)
MCHC: 32.7 g/dL (ref 30.0–36.0)
MCV: 89.9 fl (ref 78.0–100.0)
Monocytes Absolute: 0.5 10*3/uL (ref 0.1–1.0)
Monocytes Relative: 6.1 % (ref 3.0–12.0)
Neutro Abs: 6.2 10*3/uL (ref 1.4–7.7)
Neutrophils Relative %: 68.7 % (ref 43.0–77.0)
PLATELETS: 301 10*3/uL (ref 150.0–400.0)
RBC: 5.27 Mil/uL (ref 4.22–5.81)
RDW: 13.6 % (ref 11.5–15.5)
WBC: 9 10*3/uL (ref 4.0–10.5)

## 2014-04-26 MED ORDER — PREDNISONE 10 MG PO TABS: ORAL_TABLET | ORAL | Status: DC

## 2014-04-26 MED ORDER — ACETAMINOPHEN-CODEINE #3 300-30 MG PO TABS: ORAL_TABLET | ORAL | Status: DC

## 2014-04-26 MED ORDER — FAMOTIDINE 20 MG PO TABS: ORAL_TABLET | ORAL | Status: DC

## 2014-04-26 MED ORDER — PANTOPRAZOLE SODIUM 40 MG PO TBEC: 40.0000 mg | DELAYED_RELEASE_TABLET | Freq: Every day | ORAL | Status: DC

## 2014-04-26 NOTE — Progress Notes (Signed)
Subjective:     Patient ID: Jon Powell, male   DOB: 02/11/1976, 39 y.o.   MRN: [id number]  HPI  [age] yobm never smoker perfectly healthy [employer] last served abroad 2012 with onset of symptoms in 2010  While in [place]  With respiratory symptoms consisting of doe bewteen and severe coughing paroxyms x 2 months referred to pulmonary clinic 04/26/2014 by Dr Jon Powell  04/26/2014 1st [facility] office visit/ Jon Powell  Chronic doe x 3 years  Chief Complaint  Patient presents with  . Advice Only    [facility]; heavy cough, difficulty breathing, wheezing  this episode onset acute  Nov mid 2015 with scratchy throat then 3 days later bad cough to point of vomiting  With light green  sev tbsp esp at hs No resp to date from mucinex dm/ tessilon    No obvious day to day or daytime variabilty or assoc cp or chest tightness, subjective wheeze overt sinus or hb symptoms. No unusual exp hx or h/o childhood pna/ asthma or knowledge of premature birth.  Sleeping ok without nocturnal  or early am exacerbation  of respiratory  c/o's or need for noct saba. Also denies any obvious fluctuation of symptoms with weather or environmental changes or other aggravating or alleviating factors except as outlined above   Current Medications, Allergies, Complete Past Medical History, Past Surgical History, Family History, and Social History were reviewed [facility] electronic [facility].  ROS  The following are not active complaints unless bolded sore throat, dysphagia, dental problems, itching, sneezing,  nasal congestion or excess/ purulent secretions, ear ache,   fever, chills, sweats, unintended wt loss, pleuritic or exertional cp, hemoptysis,  orthopnea pnd or leg swelling, presyncope, palpitations, heartburn, abdominal pain, anorexia, nausea, vomiting, diarrhea  or change in bowel or urinary habits, change in stools or urine, dysuria,hematuria,  rash, arthralgias, visual complaints, headache,  numbness weakness or ataxia or problems with walking or coordination,  change in mood/affect or memory.         Review of Systems     Objective:   Physical Exam Wt Readings from Last 3 Encounters:  04/26/14 235 lb 12.8 oz (106.958 kg)  04/19/14 234 lb 6.4 oz (106.323 kg)  04/11/14 224 lb 9.6 oz (101.878 kg)    Vital signs reviewed   amb wm with classic pseudowheezing   HEENT: nl dentition, turbinates, and orophanx. Nl external ear canals without cough reflex   NECK :  without JVD/Nodes/TM/ nl carotid upstrokes bilaterally   LUNGS: no acc muscle use, clear to A and P bilaterally without cough on insp or exp maneuvers   CV:  RRR  no s3 or murmur or increase in P2, no edema   ABD:  soft and nontender with nl excursion in the supine position. No bruits or organomegaly, bowel sounds nl  MS:  warm without deformities, calf tenderness, cyanosis or clubbing  SKIN: warm and dry without lesions    NEURO:  alert, approp, no deficits   cxr 04/26/14 No active cardiopulmonary disease.   Lab Results  Component Value Date   WBC 9.0 04/26/2014   HGB 15.5 04/26/2014   HCT 47.4 04/26/2014   MCV 89.9 04/26/2014   PLT 301.0 04/26/2014  Eos 1.1%     Assessment:

## 2014-04-26 NOTE — Patient Instructions (Signed)
For cough > delsym 2 tsp every 12  Hours as needed and supplement Tylenol #3 every 4 hours as needed   For drainage take chlortrimeton (chlorpheniramine) 4 mg every 4 hours available over the counter (may cause drowsiness)      Prednisone 10 mg take  4 each am x 2 days,   2 each am x 2 days,  1 each am x 2 days and stop (this is to eliminate allergies and inflammation from coughing)  Protonix (pantoprazole) Take 30-60 min before first meal of the day and Pepcid 20 mg one bedtime plus chlorpheniramine 4 mg x 2 at bedtime (both available over the counter)  until cough is completely gone for at least a week without the need for cough suppression  GERD (REFLUX)  is an extremely common cause of respiratory symptoms, many times with no significant heartburn at all.    It can be treated with medication, but also with lifestyle changes including avoidance of late meals, excessive alcohol, smoking cessation, and avoid fatty foods, chocolate, peppermint, colas, red wine, and acidic juices such as orange juice.  NO MINT OR MENTHOL PRODUCTS SO NO COUGH DROPS  USE HARD CANDY INSTEAD (jolley ranchers or Stover's or Lifesavers (all available in sugarless versions) NO OIL BASED VITAMINS - use powdered substitutes.  Please see patient coordinator before you leave today  to schedule sinus CT   Please remember to go to the lab and x-ray department downstairs for your tests - we will call you with the results when they are available.

## 2014-04-27 ENCOUNTER — Ambulatory Visit (INDEPENDENT_AMBULATORY_CARE_PROVIDER_SITE_OTHER)
Admission: RE | Admit: 2014-04-27 | Discharge: 2014-04-27 | Disposition: A | Discharge status: Home / Self Care | Source: Ambulatory Visit | Attending: Internal Medicine | Admitting: Internal Medicine

## 2014-04-27 ENCOUNTER — Other Ambulatory Visit: Payer: Self-pay | Admitting: Internal Medicine

## 2014-04-27 DIAGNOSIS — R058 Other specified cough: Secondary | ICD-10-CM

## 2014-04-27 DIAGNOSIS — R05 Cough: Secondary | ICD-10-CM

## 2014-04-27 LAB — ALLERGY FULL PROFILE
Allergen, D pternoyssinus,d7: 1.84 kU/L — ABNORMAL HIGH
Allergen,Goose feathers, e70: <0.1 kU/L
Alternaria Alternata: <0.1 kU/L
Aspergillus fumigatus, m3: <0.1 kU/L
Bahia Grass: 1.63 kU/L — ABNORMAL HIGH
Bermuda Grass: 0.64 kU/L — ABNORMAL HIGH
Box Elder IgE: 0.25 kU/L — ABNORMAL HIGH
CANDIDA ALBICANS: 0.11 kU/L — AB
Cat Dander: 0.19 kU/L — ABNORMAL HIGH
Curvularia lunata: <0.1 kU/L
D. farinae: 1.83 kU/L — ABNORMAL HIGH
Dog Dander: 0.36 kU/L — ABNORMAL HIGH
Elm IgE: 0.57 kU/L — ABNORMAL HIGH
FESCUE: 1.47 kU/L — AB
G005 RYE, PERENNIAL: 1.59 kU/L — AB
G009 RED TOP: 1.52 kU/L — AB
Goldenrod: <0.1 kU/L
Helminthosporium halodes: <0.1 kU/L
House Dust Hollister: 0.27 kU/L — ABNORMAL HIGH
IgE (Immunoglobulin E), Serum: 62 kU/L (ref <115)
LAMB'S QUARTERS CLASS: 0.32 kU/L — AB
Oak: <0.1 kU/L
PLANTAIN: 0.26 kU/L — AB
Stemphylium Botryosum: <0.1 kU/L
Sycamore Tree: 0.13 kU/L — ABNORMAL HIGH
Timothy Grass: 1.43 kU/L — ABNORMAL HIGH

## 2014-04-27 NOTE — Assessment & Plan Note (Addendum)
The most common causes of chronic cough in immunocompetent adults include the following: upper airway cough syndrome (UACS), previously referred to as postnasal drip syndrome (PNDS), which is caused by variety of rhinosinus conditions; (2) asthma; (3) GERD; (4) chronic bronchitis from cigarette smoking or other inhaled environmental irritants; (5) nonasthmatic eosinophilic bronchitis; and (6) bronchiectasis.   These conditions, singly or in combination, have accounted for up to 94% of the causes of chronic cough in prospective studies.   Other conditions have constituted no >6% of the causes in prospective studies These have included bronchogenic carcinoma, chronic interstitial pneumonia, sarcoidosis, left ventricular failure, ACEI-induced cough, and aspiration from a condition associated with pharyngeal dysfunction.    Chronic cough is often simultaneously caused by more than one condition. A single cause has been found from 38 to 82% of the time, multiple causes from 18 to 62%. Multiply caused cough has been the result of three diseases up to 42% of the time.       Based on hx and exam, this is most likely:  Classic Upper airway cough syndrome, so named because it's frequently impossible to sort out how much is  CR/sinusitis with freq throat clearing (which can be related to primary GERD)   vs  causing  secondary (" extra esophageal")  GERD from wide swings in gastric pressure that occur with throat clearing or cough (clearly happening in this case as reports he coughs so hard he vomits), often  promoting self use of mint and menthol lozenges that reduce the lower esophageal sphincter tone and exacerbate the problem further in a cyclical fashion.   These are the same pts (now being labeled as having "irritable larynx syndrome" by some cough centers) who not infrequently have a history of having failed to tolerate ace inhibitors,  dry powder inhalers or biphosphonates or report having atypical reflux  symptoms that don't respond to standard doses of PPI , and are easily confused as having aecopd or asthma flares by even experienced allergists/ pulmonologists.   The first step is to maximize acid suppression and eliminate cyclical coughing then regroup with pfts and perhaps methacholine challenge to r/o asthma and sort out why he  Chronically reports  Doe between flares of cough   See instructions for specific recommendations which were reviewed directly with the patient who was given a copy with highlighter outlining the key components.

## 2014-04-28 ENCOUNTER — Ambulatory Visit
Admission: RE | Admit: 2014-04-28 | Discharge: 2014-04-28 | Disposition: A | Discharge status: Home / Self Care | Source: Ambulatory Visit | Attending: Diagnostic Neuroimaging | Admitting: Diagnostic Neuroimaging

## 2014-04-28 DIAGNOSIS — G43109 Migraine with aura, not intractable, without status migrainosus: Secondary | ICD-10-CM

## 2014-04-28 DIAGNOSIS — F4310 Post-traumatic stress disorder, unspecified: Secondary | ICD-10-CM

## 2014-04-28 DIAGNOSIS — G4700 Insomnia, unspecified: Secondary | ICD-10-CM

## 2014-04-28 NOTE — Progress Notes (Signed)
Quick Note:  LMTCB ______ 

## 2014-04-28 NOTE — Progress Notes (Signed)
Quick Note:  Spoke with pt and notified of results per Dr. Wert. Pt verbalized understanding and denied any questions.  ______ 

## 2014-05-22 ENCOUNTER — Ambulatory Visit (INDEPENDENT_AMBULATORY_CARE_PROVIDER_SITE_OTHER): Admitting: Internal Medicine

## 2014-05-22 ENCOUNTER — Encounter: Payer: Self-pay | Admitting: Internal Medicine

## 2014-05-22 VITALS — BP 138/84 | HR 97 | Temp 98.5°F | Ht 70.25 in | Wt 230.2 lb

## 2014-05-22 DIAGNOSIS — R058 Other specified cough: Secondary | ICD-10-CM

## 2014-05-22 DIAGNOSIS — J45991 Cough variant asthma: Secondary | ICD-10-CM

## 2014-05-22 DIAGNOSIS — R05 Cough: Secondary | ICD-10-CM

## 2014-05-22 DIAGNOSIS — J984 Other disorders of lung: Secondary | ICD-10-CM

## 2014-05-22 LAB — PULMONARY FUNCTION TEST
DL/VA % pred: 152 %
DL/VA: 7.14 ml/min/mmHg/L
DLCO UNC: 28.33 ml/min/mmHg
DLCO unc % pred: 86 %
FEF 25-75 POST: 5.75 L/s
FEF 25-75 PRE: 3.46 L/s
FEF2575-%Change-Post: 66 %
FEF2575-%PRED-PRE: 83 %
FEF2575-%Pred-Post: 139 %
FEV1-%Change-Post: 16 %
FEV1-%PRED-POST: 66 %
FEV1-%Pred-Pre: 57 %
FEV1-PRE: 2.47 L
FEV1-Post: 2.88 L
FEV1FVC-%CHANGE-POST: 3 %
FEV1FVC-%PRED-PRE: 109 %
FEV6-%CHANGE-POST: 12 %
FEV6-%Pred-Post: 59 %
FEV6-%Pred-Pre: 53 %
FEV6-POST: 3.16 L
FEV6-Pre: 2.81 L
FEV6FVC-%PRED-PRE: 102 %
FEV6FVC-%Pred-Post: 102 %
FVC-%Change-Post: 12 %
FVC-%Pred-Post: 58 %
FVC-%Pred-Pre: 52 %
FVC-PRE: 2.81 L
FVC-Post: 3.16 L
POST FEV6/FVC RATIO: 100 %
PRE FEV1/FVC RATIO: 88 %
PRE FEV6/FVC RATIO: 100 %
Post FEV1/FVC ratio: 91 %
RV % PRED: 58 %
RV: 1.05 L
TLC % pred: 56 %
TLC: 3.97 L

## 2014-05-22 MED ORDER — MOMETASONE FURO-FORMOTEROL FUM 100-5 MCG/ACT IN AERO: INHALATION_SPRAY | RESPIRATORY_TRACT | Status: DC

## 2014-05-22 NOTE — Progress Notes (Signed)
PFT done today. 

## 2014-05-22 NOTE — Progress Notes (Signed)
Subjective:     Patient ID: Jon Powell, male   DOB: 09-07-1975 .   MRN: [id number]    Brief patient profile:  38 yobm never smoker perfectly healthy [employer] last served abroad 2012 with onset of symptoms in 2010  While in [place]  With respiratory symptoms consisting of doe bewteen and severe coughing paroxyms x 2 months referred to pulmonary clinic 04/26/2014 by Dr Jon Powell with possible asthma   History of Present Illness  04/26/2014 1st [facility] office visit/ Jon Powell  Chronic doe x 3 years  Chief Complaint  Patient presents with  . Advice Only    referred by PA at [facility]; heavy cough, difficulty breathing, wheezing  this episode onset acute  Nov mid 2015 with scratchy throat then 3 days later bad cough to point of vomiting  With light green  sev tbsp esp at hs No resp to date from mucinex dm/ tessilon  Doe x  2 miles s sopping but times have fallen off s much variation = 17 m For cough > delsym 2 tsp every 12  Hours as needed and supplement Tylenol #3 every 4 hours as needed  For drainage take chlortrimeton (chlorpheniramine) 4 mg every 4 hours available over the counter (may cause drowsiness)     Prednisone 10 mg take  4 each am x 2 days,   2 each am x 2 days,  1 each am x 2 days and stop (this is to eliminate allergies and inflammation from coughing) Protonix (pantoprazole) Take 30-60 min before first meal of the day and Pepcid 20 mg one bedtime plus chlorpheniramine 4 mg x 2 at bedtime (both available over the counter)  until cough is completely gone for at least a week without the need for cough suppression GERD  Please see patient coordinator before you leave today  to schedule sinus CT    05/22/2014 f/u ov/Jon Powell re: possible asthma  Chief Complaint  Patient presents with  . Follow-up    Review PFT. Pt states that cough has improved, still having lightheadedness.     Doe x 17 min for 2 miles (prev 2 miles in 14 min) , doe one flight > pred helped some transiently    No obvious  day to day or daytime variabilty or assoc cp or chest tightness, subjective wheeze overt sinus or hb symptoms. No unusual exp hx or h/o childhood pna/ asthma or knowledge of premature birth.  Sleeping ok without nocturnal  or early am exacerbation  of respiratory  c/o's or need for noct saba. Also denies any obvious fluctuation of symptoms with weather or environmental changes or other aggravating or alleviating factors except as outlined above   Current Medications, Allergies, Complete Past Medical History, Past Surgical History, Family History, and Social History were reviewed in [employer] record.  ROS  The following are not active complaints unless bolded sore throat, dysphagia, dental problems, itching, sneezing,  nasal congestion or excess/ purulent secretions, ear ache,   fever, chills, sweats, unintended wt loss, pleuritic or exertional cp, hemoptysis,  orthopnea pnd or leg swelling, presyncope, palpitations, heartburn, abdominal pain, anorexia, nausea, vomiting, diarrhea  or change in bowel or urinary habits, change in stools or urine, dysuria,hematuria,  rash, arthralgias, visual complaints, headache, numbness weakness or ataxia or problems with walking or coordination,  change in mood/affect or memory.              Objective:   Physical Exam  05/22/2014  230  Wt Readings from Last 3 Encounters:  04/26/14 235 lb 12.8 oz (106.958 kg)  04/19/14 234 lb 6.4 oz (106.323 kg)  04/11/14 224 lb 9.6 oz (101.878 kg)    Vital signs reviewed   amb wm with min pseudowheezing   HEENT: nl dentition, turbinates, and orophanx. Nl external ear canals without cough reflex   NECK :  without JVD/Nodes/TM/ nl carotid upstrokes bilaterally   LUNGS: no acc muscle use, clear to A and P bilaterally without cough on insp or exp maneuvers   CV:  RRR  no s3 or murmur or increase in P2, no edema   ABD:  soft and nontender with nl excursion in the supine position. No  bruits or organomegaly, bowel sounds nl  MS:  warm without deformities, calf tenderness, cyanosis or clubbing  SKIN: warm and dry without lesions        cxr 04/26/14 No active cardiopulmonary disease.   Lab Results  Component Value Date   WBC 9.0 04/26/2014   HGB 15.5 04/26/2014   HCT 47.4 04/26/2014   MCV 89.9 04/26/2014   PLT 301.0 04/26/2014  Eos 1.1%     Assessment:

## 2014-05-22 NOTE — Patient Instructions (Signed)
dulera 100 Take 2 puffs first thing in am and then another 2 puffs about 12 hours later and fill the prescription and if not don't   Work on inhaler technique:  relax and gently blow all the way out then take a nice smooth deep breath back in, triggering the inhaler at same time you start breathing in.  Hold for up to 5 seconds if you can.  Rinse and gargle with water when done  Please schedule a follow up office visit in 6 weeks, call sooner if needed

## 2014-05-23 ENCOUNTER — Encounter: Payer: Self-pay | Admitting: Internal Medicine

## 2014-05-23 DIAGNOSIS — J984 Other disorders of lung: Secondary | ICD-10-CM | POA: Insufficient documentation

## 2014-05-23 NOTE — Assessment & Plan Note (Signed)
-   allergy profile 04/26/2014 >  Eos 1.1%  IgE 62 multiple pos RAST dog > cat  Trees/grass - sinus CT 04/27/2014 > No acute abnormalities. - PFTs 05/22/14 no airflow obst but 16% improvement in FEV1 p saba - 05/22/2014 p extensive coaching HFA effectiveness =    > rec trial of dulera 100 2 bid    Still not clear how much is asthma vs gerd related pseudoasthma but since reports improved on pred rec trial of dulera then step down to qvar if improves     Each maintenance medication was reviewed in detail including most importantly the difference between maintenance and as needed and under what circumstances the prns are to be used.  Please see instructions for details which were reviewed in writing and the patient given a copy.

## 2014-05-23 NOTE — Assessment & Plan Note (Signed)
PFTs 05/22/14 with VC 54% and nl dlco with low lung vol on cxr and erv of 42%  There is no evidence of IL so this must be obesity related and probably explains reduction in ex tolerance>advised re wt loss

## 2014-05-25 ENCOUNTER — Encounter: Payer: Self-pay | Admitting: Diagnostic Neuroimaging

## 2014-05-25 ENCOUNTER — Ambulatory Visit (INDEPENDENT_AMBULATORY_CARE_PROVIDER_SITE_OTHER): Admitting: Diagnostic Neuroimaging

## 2014-05-25 ENCOUNTER — Ambulatory Visit: Admitting: Diagnostic Neuroimaging

## 2014-05-25 VITALS — BP 125/73 | HR 87 | Ht 70.5 in | Wt 240.2 lb

## 2014-05-25 DIAGNOSIS — F4310 Post-traumatic stress disorder, unspecified: Secondary | ICD-10-CM

## 2014-05-25 DIAGNOSIS — G4486 Cervicogenic headache: Secondary | ICD-10-CM

## 2014-05-25 DIAGNOSIS — R51 Headache: Secondary | ICD-10-CM

## 2014-05-25 DIAGNOSIS — G43109 Migraine with aura, not intractable, without status migrainosus: Secondary | ICD-10-CM

## 2014-05-25 NOTE — Patient Instructions (Signed)
Take topiramate on daily basis.  Try physical therapy and massage therapy.

## 2014-05-25 NOTE — Progress Notes (Signed)
[facility]  PATIENT: Jon Powell DOB: 1976-04-09  REFERRING CLINICIAN: Mani HISTORY FROM: patient  REASON FOR VISIT: follow up   HISTORICAL  CHIEF COMPLAINT:  Chief Complaint  Patient presents with  . Follow-up    migraines     HISTORY OF PRESENT ILLNESS:     UPDATE 05/25/14 ([facility]): Since last visit patient has been variably taking topiramate, because of perceived side effect of acid reflux. Patient had previous acid reflux and was prescribed Pepcid but has not taken it. Patient not totally sure if topiramate is aggravating acid reflux symptoms. Patient has taken sumatriptan a few times for severe headaches, and along with rest has helped reduce the severity. Patient still having 8-12 severe migraine headaches per month. He also has lower level daily headaches. Patient also feels left-sided neck pain, shoulder pain, which seems to trigger his headaches.  PRIOR HPI (04/19/14): 39 year old right-handed male here for evaluation of headaches. 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 When he returned, one year later he developed severe depression, anxiety and PTSD symptoms.  Patient has had difficulty with sleep, averaging or a 3-4 hours per night. Around this time he began to develop new type of headaches, starting with fatigue, neck pain, ringing in ears, blurred vision, tunnel vision, followed by sharp ice pick stabbing pain in the left side of his head, sometimes on the top and sometimes on the back. Headaches are associated with nausea, vomiting, presyncope feeling, photophobia and phonophobia. Headaches can last 2-4 hours at a time, sometimes lasting until the next day. He has these headaches 1-3 times per week. No specific triggering factors. Patient usually takes Aleve, Excedrin, lays down in a dark quiet room, and sometimes uses a cool washcloth on his head. Patient has been prescribed topiramate and sumatriptan by urgent care, and he has  filled these medications but has not tried them yet. Patient does not have neuroimaging studies of the brain. No family history of migraine. No similar headaches in the past. Patient has seen psychiatrists last year who recommended outpatient therapy/counseling sessions, but patient has not pursued this. Patient is reluctant to take medications for depression anxiety.   REVIEW OF SYSTEMS: Full 14 system review of systems performed and notable only for: light sens blurred vision nausea insomnia snoring neck pain neck stiffness dizziness headache weakness.   ALLERGIES: Allergies  Allergen Reactions  . Doxycycline Rash    HOME MEDICATIONS: Outpatient Prescriptions Prior to Visit  Medication Sig Dispense Refill  . acetaminophen-codeine (TYLENOL #3) 300-30 MG per tablet One every 4 hours as needed for cough 40 tablet 0  . clobetasol cream (TEMOVATE) 0.05 % Apply 1 application topically 2 (two) times daily. 60 g 1  . mometasone-formoterol (DULERA) 100-5 MCG/ACT AERO Take 2 puffs first thing in am and then another 2 puffs about 12 hours later. 1 Inhaler 11  . pantoprazole (PROTONIX) 40 MG tablet Take 1 tablet (40 mg total) by mouth daily. Take 30-60 min before first meal of the day 30 tablet 2  . SUMAtriptan (IMITREX) 50 MG tablet Take 1 tablet (50 mg total) by mouth as needed for migraine or headache. May repeat in 2 hours if headache persists or recurs. 10 tablet 0  . topiramate (TOPAMAX) 50 MG tablet Take 50 mg by mouth 2 (two) times daily.    Marland Kitchen triamcinolone cream (KENALOG) 0.1 % Apply 1 application topically 3 (three) times daily. 453.6 g 5  . famotidine (PEPCID) 20 MG tablet One  at bedtime 30 tablet 2   No facility-administered medications prior to visit.    PAST MEDICAL HISTORY: Past Medical History  Diagnosis Date  . Neuromuscular disorder   . Joint disorder   . Insomnia   . Depression   . Anxiety     PAST SURGICAL HISTORY: Past Surgical History  Procedure Laterality Date  .  No past surgeries      FAMILY HISTORY: No family history on file.  SOCIAL HISTORY:  History   Social History  . Marital Status: Single    Spouse Name: 0    Number of Children: 0  . Years of Education: PhD   Occupational History  .  [employer]   Social History Main Topics  . Smoking status: Never Smoker   . Smokeless tobacco: Never Used  . Alcohol Use: 0.0 oz/week    0 Not specified per week     Comment: 1-2 drinks weekly  . Drug Use: No  . Sexual Activity: Yes    Birth Control/ Protection: Condom   Other Topics Concern  . Not on file   Social History Narrative   Patient lives at home alone.   Caffeine Use: none     PHYSICAL EXAM  Filed Vitals:   05/25/14 1511  BP: 125/73  Pulse: 87  Height: 5' 10.5" (1.791 m)  Weight: 240 lb 3.2 oz (108.954 kg)    Body mass index is 33.97 kg/(m^2).  No exam data present  No flowsheet data found.  GENERAL EXAM: Patient is in no distress; well developed, nourished and groomed; neck is supple; MILD DISTRESS, IN DARKENED ROOM   CARDIOVASCULAR: Regular rate and rhythm, no murmurs, no carotid bruits  NEUROLOGIC: MENTAL STATUS: awake, alert, language fluent, comprehension intact, naming intact, fund of knowledge appropriate CRANIAL NERVE: pupils equal and reactive to light, visual fields full to confrontation, extraocular muscles intact, no nystagmus, facial sensation and strength symmetric, hearing intact, palate elevates symmetrically, uvula midline, shoulder shrug symmetric, tongue midline. MOTOR: normal bulk and tone, full strength in the BUE, BLE SENSORY: normal and symmetric to light touch, temperature, vibration COORDINATION: finger-nose-finger, fine finger movements normal REFLEXES: deep tendon reflexes present and symmetric GAIT/STATION: narrow based gait; romberg is negative    DIAGNOSTIC DATA (LABS, IMAGING, TESTING) - I reviewed patient records, labs, notes, testing and imaging myself where available.  Lab  Results  Component Value Date   WBC 9.0 04/26/2014   HGB 15.5 04/26/2014   HCT 47.4 04/26/2014   MCV 89.9 04/26/2014   PLT 301.0 04/26/2014   No results found for: NA, K, CL, CO2, GLUCOSE, BUN, CREATININE, CALCIUM, PROT, ALBUMIN, AST, ALT, ALKPHOS, BILITOT, GFRNONAA, GFRAA No results found for: CHOL, HDL, LDLCALC, LDLDIRECT, TRIG, CHOLHDL No results found for: MVHQ4OHGBA1C No results found for: VITAMINB12 No results found for: TSH     ASSESSMENT AND PLAN  39 y.o. year old male here with new-onset headaches since 2011, in setting of PTSD, depression, anxiety, insomnia. Headaches have migraine features. Also has component of cervicogenic headaches.   PLAN: - encouraged patient to take daily topiramate - continue sumatriptan prn - PT evaluation/treatment of neck pain  Orders Placed This Encounter  Procedures  . Ambulatory referral to Physical Therapy   Return in about 3 months (around 08/23/2014).    Suanne MarkerVIKRAM R. Daphne Karrer, MD 05/25/2014, 3:48 PM Certified in Neurology, Neurophysiology and Neuroimaging  [facility] [address], [address] [place], [place] [address] [phone]

## 2014-05-29 ENCOUNTER — Ambulatory Visit: Admitting: Diagnostic Neuroimaging

## 2014-05-30 ENCOUNTER — Ambulatory Visit: Admitting: Diagnostic Neuroimaging

## 2014-06-06 ENCOUNTER — Ambulatory Visit: Admitting: Diagnostic Neuroimaging

## 2014-06-28 ENCOUNTER — Telehealth: Payer: Self-pay | Admitting: Internal Medicine

## 2014-06-28 NOTE — Telephone Encounter (Signed)
Would prefer symbicort 80 2bid  But if not covering that then ok to try advair 115 /21 2 bid

## 2014-06-28 NOTE — Telephone Encounter (Signed)
Pt's insurance will not cover Dulera 200. They will cover Advair.  MW - please advise. Thanks.

## 2014-06-29 MED ORDER — FLUTICASONE-SALMETEROL 115-21 MCG/ACT IN AERO: 2.0000 | INHALATION_SPRAY | Freq: Two times a day (BID) | RESPIRATORY_TRACT | Status: DC

## 2014-06-29 NOTE — Telephone Encounter (Signed)
Rx has been sent in per MW. Nothing further was needed. 

## 2014-07-04 ENCOUNTER — Ambulatory Visit (INDEPENDENT_AMBULATORY_CARE_PROVIDER_SITE_OTHER): Admitting: Internal Medicine

## 2014-07-04 ENCOUNTER — Encounter: Payer: Self-pay | Admitting: Internal Medicine

## 2014-07-04 VITALS — BP 124/84 | HR 85 | Ht 71.0 in | Wt 222.2 lb

## 2014-07-04 DIAGNOSIS — J45991 Cough variant asthma: Secondary | ICD-10-CM

## 2014-07-04 MED ORDER — FLUTICASONE-SALMETEROL 115-21 MCG/ACT IN AERO: 2.0000 | INHALATION_SPRAY | Freq: Two times a day (BID) | RESPIRATORY_TRACT | Status: DC

## 2014-07-04 NOTE — Progress Notes (Signed)
Subjective:     Patient ID: Jon Powell, male   DOB: 05-14-1975 .   MRN: [id number]    Brief patient profile:  38 yobm never smoker perfectly healthy [employer] last served abroad 2012 with onset of symptoms in 2010  While in [place]  With respiratory symptoms consisting of doe bewteen and severe coughing paroxyms x 2 months referred to pulmonary clinic 04/26/2014 by Dr Marlowe Shores with possible asthma     History of Present Illness  04/26/2014 1st [facility] office visit/ Wert  Chronic doe x 3 years  Chief Complaint  Patient presents with  . Advice Only    referred by PA at [facility]; heavy cough, difficulty breathing, wheezing  this episode onset acute  Nov mid 2015 with scratchy throat then 3 days later bad cough to point of vomiting  With light green  sev tbsp esp at hs No resp to date from mucinex dm/ tessilon  Doe x  2 miles s sopping but times have fallen off s much variation = 17 m For cough > delsym 2 tsp every 12  Hours as needed and supplement Tylenol #3 every 4 hours as needed  For drainage take chlortrimeton (chlorpheniramine) 4 mg every 4 hours available over the counter (may cause drowsiness)     Prednisone 10 mg take  4 each am x 2 days,   2 each am x 2 days,  1 each am x 2 days and stop (this is to eliminate allergies and inflammation from coughing) Protonix (pantoprazole) Take 30-60 min before first meal of the day and Pepcid 20 mg one bedtime plus chlorpheniramine 4 mg x 2 at bedtime (both available over the counter)  until cough is completely gone for at least a week without the need for cough suppression GERD  Please see patient coordinator before you leave today  to schedule sinus CT    05/22/2014 f/u ov/Wert re: possible asthma  Chief Complaint  Patient presents with  . Follow-up    Review PFT. Pt states that cough has improved, still having lightheadedness.    Doe x 17 min for 2 miles (prev 2 miles in 14 min) , doe one flight > pred helped some transiently  rec dulera  100 Take 2 puffs first thing in am and then another 2 puffs about 12 hours later and fill the prescription and if not don't  Work on inhaler technique:        07/04/2014 f/u ov/Wert re: probable asthma/ cough variant Chief Complaint  Patient presents with  . Follow-up    Pt states cough continues to improve. Still feels lightheaded with exertion such as walking up stairs.      No obvious day to day or daytime variabilty or assoc excess mucus or cp or chest tightness, subjective wheeze overt sinus or hb symptoms. No unusual exp hx or h/o childhood pna/ asthma or knowledge of premature birth.  Sleeping ok without nocturnal  or early am exacerbation  of respiratory  c/o's or need for noct saba. Also denies any obvious fluctuation of symptoms with weather or environmental changes or other aggravating or alleviating factors except as outlined above   Current Medications, Allergies, Complete Past Medical History, Past Surgical History, Family History, and Social History were reviewed in [employer] record.  ROS  The following are not active complaints unless bolded sore throat, dysphagia, dental problems, itching, sneezing,  nasal congestion or excess/ purulent secretions, ear ache,   fever, chills, sweats, unintended  wt loss, pleuritic or exertional cp, hemoptysis,  orthopnea pnd or leg swelling, presyncope, palpitations, heartburn, abdominal pain, anorexia, nausea, vomiting, diarrhea  or change in bowel or urinary habits, change in stools or urine, dysuria,hematuria,  rash, arthralgias, visual complaints, headache, numbness weakness or ataxia or problems with walking or coordination,  change in mood/affect or memory.                  Objective:   Physical Exam  05/22/2014         230   > 07/04/14    222  Wt Readings from Last 3 Encounters:  04/26/14 235 lb 12.8 oz (106.958 kg)  04/19/14 234 lb 6.4 oz (106.323 kg)  04/11/14 224 lb 9.6 oz (101.878 kg)    Vital signs  reviewed   amb wm with min pseudowheezing   HEENT: nl dentition, turbinates, and orophanx. Nl external ear canals without cough reflex   NECK :  without JVD/Nodes/TM/ nl carotid upstrokes bilaterally   LUNGS: no acc muscle use, clear to A and P bilaterally without cough on insp or exp maneuvers   CV:  RRR  no s3 or murmur or increase in P2, no edema   ABD:  soft and nontender with nl excursion in the supine position. No bruits or organomegaly, bowel sounds nl  MS:  warm without deformities, calf tenderness, cyanosis or clubbing  SKIN: warm and dry without lesions        cxr 04/26/14 No active cardiopulmonary disease.   Lab Results  Component Value Date   WBC 9.0 04/26/2014   HGB 15.5 04/26/2014   HCT 47.4 04/26/2014   MCV 89.9 04/26/2014   PLT 301.0 04/26/2014  Eos 1.1%     Assessment:

## 2014-07-04 NOTE — Patient Instructions (Addendum)
When dulera used up, change to advair 115 Take 2 puffs first thing in am and then another 2 puffs about 12 hours later.   Work on Chemical engineerperfecting inhaler technique:  relax and gently blow all the way out then take a nice smooth deep breath back in, triggering the inhaler at same time you start breathing in.  Hold for up to 5 seconds if you can.  Rinse and gargle with water when done  Best allergy medication is zyrtec one at bedtime as needed   Still not doing well > can add montelukast (singulair 10 mg each pm) trial and should work in a week or two - call if you want to try it   Keep pets out of bedroom   Please schedule a follow up visit in 3 months but call sooner if needed

## 2014-07-06 ENCOUNTER — Encounter: Payer: Self-pay | Admitting: Internal Medicine

## 2014-07-06 NOTE — Assessment & Plan Note (Addendum)
-  allergy profile 04/26/2014 >  Eos 1.1%  IgE 62 multiple pos RAST dog > cat  Trees/grass - sinus CT 04/27/2014 > No acute abnormalities. - PFTs 05/22/14 no airflow obst but 16% improvement in FEV1 p saba - 05/22/2014 p extensive coaching HFA effectiveness =    > rec trial of dulera 100 2 bid    All goals of chronic asthma control met including optimal function and elimination of symptoms with minimal need for rescue therapy.  Contingencies discussed in full including contacting this office immediately if not controlling the symptoms using the rule of two's.      The proper method of use, as well as anticipated side effects, of a metered-dose inhaler are discussed and demonstrated to the patient. Improved effectiveness after extensive coaching during this visit to a level of approximately  75% still needs work to optimize     Each maintenance medication was reviewed in detail including most importantly the difference between maintenance and as needed and under what circumstances the prns are to be used.  Please see instructions for details which were reviewed in writing and the patient given a copy.

## 2014-07-19 ENCOUNTER — Telehealth: Payer: Self-pay | Admitting: Internal Medicine

## 2014-07-19 NOTE — Telephone Encounter (Signed)
Error

## 2014-08-28 ENCOUNTER — Encounter: Payer: Self-pay | Admitting: Internal Medicine

## 2014-08-28 ENCOUNTER — Ambulatory Visit (INDEPENDENT_AMBULATORY_CARE_PROVIDER_SITE_OTHER): Admitting: Internal Medicine

## 2014-08-28 VITALS — BP 104/68 | HR 84 | Ht 71.0 in | Wt 227.0 lb

## 2014-08-28 DIAGNOSIS — J45991 Cough variant asthma: Secondary | ICD-10-CM

## 2014-08-28 MED ORDER — BUDESONIDE-FORMOTEROL FUMARATE 80-4.5 MCG/ACT IN AERO: INHALATION_SPRAY | RESPIRATORY_TRACT | Status: AC

## 2014-08-28 MED ORDER — MONTELUKAST SODIUM 10 MG PO TABS: 10.0000 mg | ORAL_TABLET | Freq: Every day | ORAL | Status: DC

## 2014-08-28 NOTE — Patient Instructions (Signed)
Stop advair and start symbicort 80 Take 2 puffs first thing in am and then another 2 puffs about 12 hours later.   Let us know if your drug store  won't honor the 25 dollar guarantee and we will have the company call them directly  Add singulair 10 mg daily in evening   Dr Shelle Ironlance and Angelena Solelittman will be staffing [facility] [place] and if not establish with one of them return here in 3 months - call sooner if needed

## 2014-08-28 NOTE — Progress Notes (Signed)
Subjective:     Patient ID: Jon Powell, male   DOB: 01/28/1976 .   MRN: [id number]    Brief patient profile:  38 yobm never smoker perfectly healthy [employer] last served abroad 2012 with onset of symptoms in 2010  While in [place]  With respiratory symptoms consisting of doe bewteen and severe coughing paroxyms x 2 months referred to pulmonary clinic 04/26/2014 by Dr Marlowe ShoresM Mani with possible asthma     History of Present Illness  04/26/2014 1st [facility] office visit/ [facility]  Chronic doe x 3 years  Chief Complaint  Patient presents with  . Advice Only    [facility]; heavy cough, difficulty breathing, wheezing  this episode onset acute  Nov mid 2015 with scratchy throat then 3 days later bad cough to point of vomiting  With light green  sev tbsp esp at hs No resp to date from mucinex dm/ tessilon  Doe x  2 miles s sopping but times have fallen off s much variation = 17 m For cough > delsym 2 tsp every 12  Hours as needed and supplement Tylenol #3 every 4 hours as needed  For drainage take chlortrimeton (chlorpheniramine) 4 mg every 4 hours available over the counter (may cause drowsiness)     Prednisone 10 mg take  4 each am x 2 days,   2 each am x 2 days,  1 each am x 2 days and stop (this is to eliminate allergies and inflammation from coughing) Protonix (pantoprazole) Take 30-60 min before first meal of the day and Pepcid 20 mg one bedtime plus chlorpheniramine 4 mg x 2 at bedtime (both available over the counter)  until cough is completely gone for at least a week without the need for cough suppression GERD  Please see patient coordinator before you leave today  to schedule sinus CT    05/22/2014 f/u ov/Wert re: possible asthma  Chief Complaint  Patient presents with  . Follow-up    Review PFT. Pt states that cough has improved, still having lightheadedness.    Doe x 17 min for 2 miles (prev 2 miles in 14 min) , doe one flight > pred helped some transiently  rec dulera  100 Take 2 puffs first thing in am and then another 2 puffs about 12 hours later and fill the prescription and if not don't  Work on inhaler technique:        07/04/2014 f/u ov/Wert re: probable asthma/ cough variant Chief Complaint  Patient presents with  . Follow-up    Pt states cough continues to improve. Still feels lightheaded with exertion such as walking up stairs.   When dulera used up, change to advair 115 Take 2 puffs first thing in am and then another 2 puffs about 12 hours later.  Work on Archivistperfecting inhaler technique:  Best allergy medication is zyrtec one at bedtime as needed  Still not doing well > can add montelukast (singulair 10 mg each pm) trial and should work in a week or two - call if you want to try it  Keep pets out of bedroom    08/28/2014 f/u ov/Wert re: cough variant on advair hfa Chief Complaint  Patient presents with  . Follow-up    Pt states that his cough is about the same since last visit. He states that his breathing seems worse "happens at random times".   was doing better on dulera than advair but never added singulair as rec  No obvious day to day or daytime variabilty or assoc excess mucus or cp or chest tightness, subjective wheeze overt sinus or hb symptoms. No unusual exp hx or h/o childhood pna/ asthma or knowledge of premature birth.  Sleeping ok without nocturnal  or early am exacerbation  of respiratory  c/o's or need for noct saba. Also denies any obvious fluctuation of symptoms with weather or environmental changes or other aggravating or alleviating factors except as outlined above   Current Medications, Allergies, Complete Past Medical History, Past Surgical History, Family History, and Social History were reviewed in [facility] electronic medical record.  ROS  The following are not active complaints unless bolded sore throat, dysphagia, dental problems, itching, sneezing,  nasal congestion or excess/ purulent secretions, ear ache,    fever, chills, sweats, unintended wt loss, pleuritic or exertional cp, hemoptysis,  orthopnea pnd or leg swelling, presyncope, palpitations, heartburn, abdominal pain, anorexia, nausea, vomiting, diarrhea  or change in bowel or urinary habits, change in stools or urine, dysuria,hematuria,  rash, arthralgias, visual complaints, headache, numbness weakness or ataxia or problems with walking or coordination,  change in mood/affect or memory.                  Objective:   Physical Exam  05/22/2014         230   > 07/04/14    222 > 08/28/2014  227  Wt Readings from Last 3 Encounters:  04/26/14 235 lb 12.8 oz (106.958 kg)  04/19/14 234 lb 6.4 oz (106.323 kg)  04/11/14 224 lb 9.6 oz (101.878 kg)    Vital signs reviewed   amb wm with    HEENT: nl dentition, turbinates, and orophanx. Nl external ear canals without cough reflex   NECK :  without JVD/Nodes/TM/ nl carotid upstrokes bilaterally   LUNGS: no acc muscle use, clear to A and P bilaterally without cough on insp or exp maneuvers   CV:  RRR  no s3 or murmur or increase in P2, no edema   ABD:  soft and nontender with nl excursion in the supine position. No bruits or organomegaly, bowel sounds nl  MS:  warm without deformities, calf tenderness, cyanosis or clubbing  SKIN: warm and dry without lesions        cxr 04/26/14 No active cardiopulmonary disease.   Lab Results  Component Value Date   WBC 9.0 04/26/2014   HGB 15.5 04/26/2014   HCT 47.4 04/26/2014   MCV 89.9 04/26/2014   PLT 301.0 04/26/2014  Eos 1.1%     Assessment:

## 2014-08-29 ENCOUNTER — Encounter: Payer: Self-pay | Admitting: Internal Medicine

## 2014-08-29 NOTE — Assessment & Plan Note (Signed)
-   allergy profile 04/26/2014 >  Eos 1.1%  IgE 62 multiple pos RAST dog > cat  Trees/grass - sinus CT 04/27/2014 > No acute abnormalities. - PFTs 05/22/14 no airflow obst but 16% improvement in FEV1 p saba - 05/22/2014 p extensive coaching HFA effectiveness =    > rec trial of dulera 100 2 bid   - 06/2014 changed to advair 115 hfa by insurance  - 08/29/2014 still having symptoms ? Asthma related :  try symbicort 80 2bid and singulair   I had an extended final summary discussion with the patient reviewing all relevant studies completed to date and  lasting 15 to 20 minutes of a 25 minute visit on the following issues:    1) he clearly has asthma with predominant cough symptoms better though still having periods of sob ? Anxiety related  2) since has allergic phenotype needs low dose ics and trial of singulair and if not well controlled consider referral to allergy   3) cannot say with certainty this is service connected illness but pt applying for [place] benefits at [place] and plans f/u there - [facility] paperwork filled out today.  4) Each maintenance medication was reviewed in detail including most importantly the difference between maintenance and as needed and under what circumstances the prns are to be used.  Please see instructions for details which were reviewed in writing and the patient given a copy.

## 2014-08-30 ENCOUNTER — Ambulatory Visit (INDEPENDENT_AMBULATORY_CARE_PROVIDER_SITE_OTHER): Admitting: Diagnostic Neuroimaging

## 2014-08-30 ENCOUNTER — Telehealth: Payer: Self-pay | Admitting: *Deleted

## 2014-08-30 ENCOUNTER — Encounter: Payer: Self-pay | Admitting: Diagnostic Neuroimaging

## 2014-08-30 VITALS — BP 118/69 | HR 83 | Ht 71.0 in | Wt 226.0 lb

## 2014-08-30 DIAGNOSIS — G4486 Cervicogenic headache: Secondary | ICD-10-CM

## 2014-08-30 DIAGNOSIS — G43109 Migraine with aura, not intractable, without status migrainosus: Secondary | ICD-10-CM

## 2014-08-30 DIAGNOSIS — R51 Headache: Secondary | ICD-10-CM

## 2014-08-30 DIAGNOSIS — Z0289 Encounter for other administrative examinations: Secondary | ICD-10-CM
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
## 2014-08-30 NOTE — Telephone Encounter (Signed)
Patient called/ returning Samantha's call and I informed him of Samantha's message from below.  I transferred patient to Angie.

## 2014-08-30 NOTE — Patient Instructions (Signed)
Continue physical therapy, stress mgmt and rest.

## 2014-08-30 NOTE — Telephone Encounter (Signed)
Left a message for the pt asking him to call me back.  He did not pay his $25 disability paperwork fee after Dr. Marjory LiesPenumalli filled it out. When he calls back, he needs to be transferred to Angie to pay this fee Thank you

## 2014-09-04 ENCOUNTER — Ambulatory Visit: Admitting: Diagnostic Neuroimaging

## 2014-09-12 ENCOUNTER — Ambulatory Visit (HOSPITAL_COMMUNITY): Admitting: Physician Assistant

## 2014-09-19 ENCOUNTER — Encounter (HOSPITAL_COMMUNITY): Payer: Self-pay | Admitting: Physician Assistant

## 2014-09-19 ENCOUNTER — Ambulatory Visit (INDEPENDENT_AMBULATORY_CARE_PROVIDER_SITE_OTHER): Admitting: Physician Assistant

## 2014-09-19 VITALS — BP 124/70 | HR 60 | Ht 71.0 in | Wt 226.0 lb

## 2014-09-19 DIAGNOSIS — F331 Major depressive disorder, recurrent, moderate: Secondary | ICD-10-CM

## 2014-09-19 DIAGNOSIS — F411 Generalized anxiety disorder: Secondary | ICD-10-CM

## 2014-09-19 NOTE — Patient Instructions (Signed)
1. [place] are [place] [facility] [facility] [place]. 2. Patient is encouraged to contact the [facility] for further evaluation and treatment. 3. He is given a copy of his forms.
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
## 2014-09-22 ENCOUNTER — Ambulatory Visit (HOSPITAL_COMMUNITY): Admitting: Psychiatry

## 2015-03-19 ENCOUNTER — Encounter: Payer: Self-pay | Admitting: *Deleted

## 2015-03-19 ENCOUNTER — Telehealth: Payer: Self-pay | Admitting: Internal Medicine

## 2015-03-19 NOTE — Telephone Encounter (Signed)
Per MW- okay for letter  I have typed this up and MW has signed copy, I have it on my desk.  LMTCB

## 2015-03-19 NOTE — Telephone Encounter (Signed)
Spoke with pt. Pt stated the most recent set of disability forms that were completed by Dr. Sherene SiresWert was correct stating the pt can have an asthma attack "4 times or more" per week. Previously the [place] the [place] incorrectly stated the pt only has attacks "4 time or more" per year. The pt is now needing a letter confirming Dr. Thurston HoleWert's recs. The pt dropped off a copy of the correct disability forms for reference if needed (nothing needs documented in the disability forms, they are just for our records), the pt also dropped off a mock letter that needs to be written by MW confirming his recs, both these documents were placed in MW's look ats.   Dr. Sherene SiresWert please advise on the letter. Thanks.

## 2015-03-19 NOTE — Telephone Encounter (Signed)
Patient calling to get a letter from Dr. Sherene SiresWert regarding his VA disability forms.  Patient says that he will drop off a copy of the forms with a draft of the letter that needs to be sent.  He is coming by today (within the next 30 minutes) to drop off the papers.  Patient would like call when letter has been completed.  FYI to Dr. Sherene SiresWert and Verlon AuLeslie

## 2015-03-19 NOTE — Telephone Encounter (Signed)
Patient is in lobby !!!!  He has forms but would like to speak to someone about them also.

## 2015-03-20 NOTE — Telephone Encounter (Signed)
Called spoke with pt. He is wanting to pick up letter. This has been placed for pick up. Nothing further needed

## 2015-03-20 NOTE — Telephone Encounter (Signed)
Done!  signed

## 2015-05-11 IMAGING — CT CT PARANASAL SINUSES LIMITED
1 of 2 series · 16 of 19 positions shown, 20 images · non-contrast
Comparison: None

CLINICAL DATA: Chronic cough and frontal sinus pressure off and on
for 3 years question sinusitis

EXAM:
CT PARANASAL SINUS LIMITED WITHOUT CONTRAST
TECHNIQUE: Non-contiguous multidetector CT images of the paranasal sinuses were
obtained in a single plane without contrast. BB placed at RIGHT-side
of face to confirm laterality.

[Series 4: ltd sinus 3.0 h30s · axial · 0.37mm/px · z∈[-66,+29]mm · 16 of 18 slices shown, 20 images]
[im 2/18  brain]
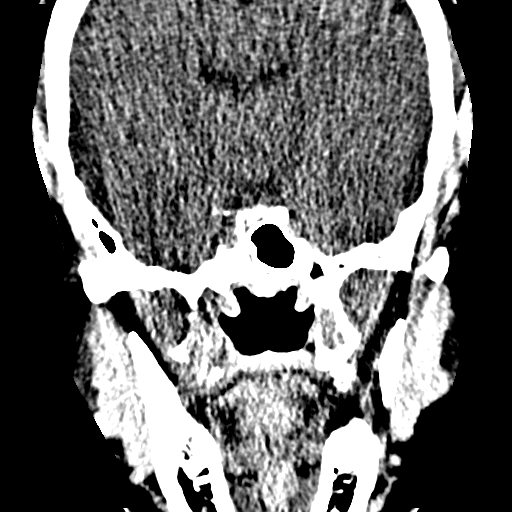
[im 2/18  bone]
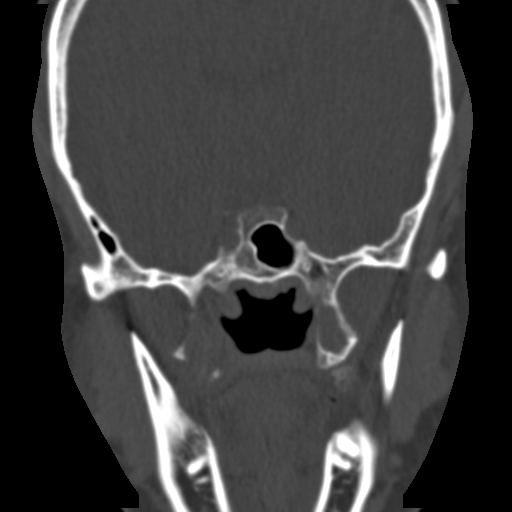
[im 3/18  bone]
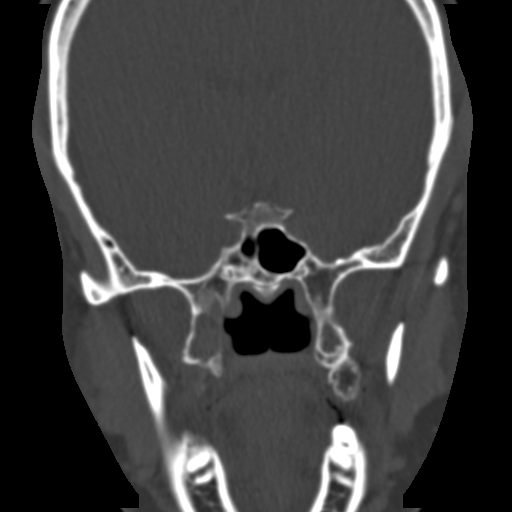
[im 4/18  bone]
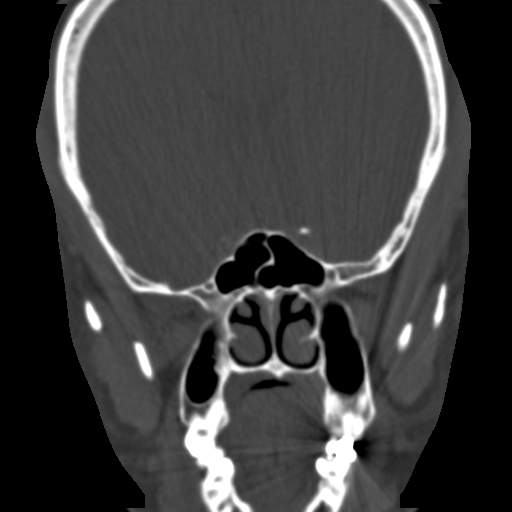
[im 5/18  bone]
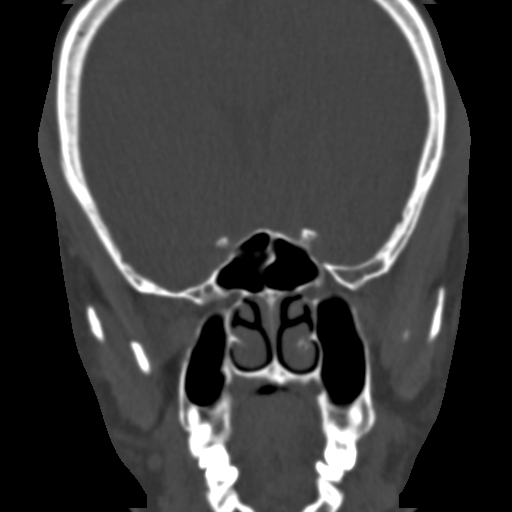
[im 6/18  brain]
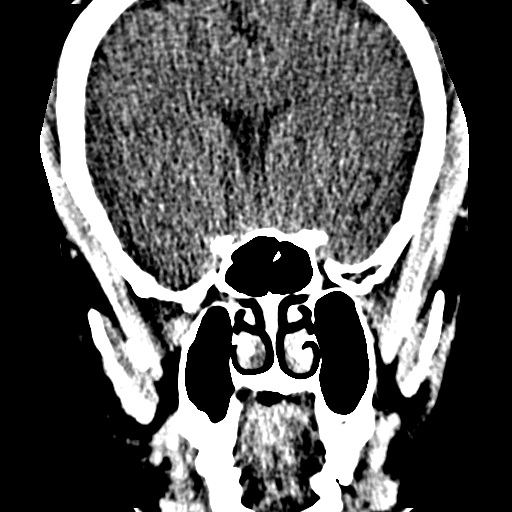
[im 6/18  bone]
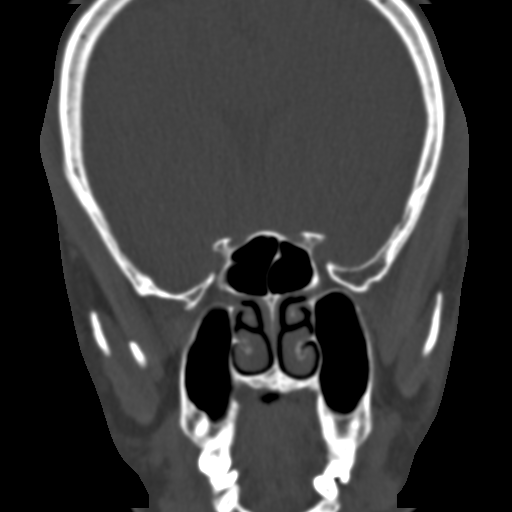
[im 7/18  bone]
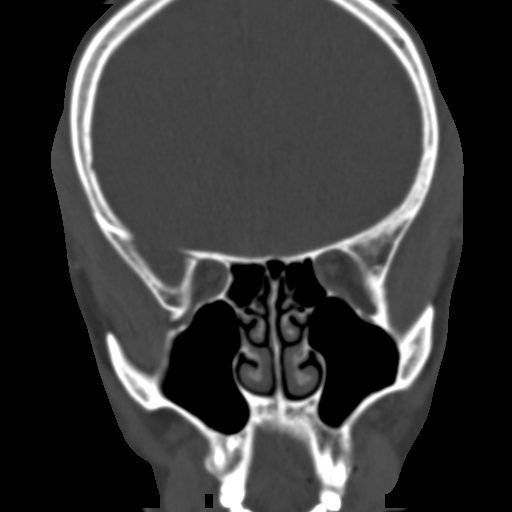
[im 8/18  bone]
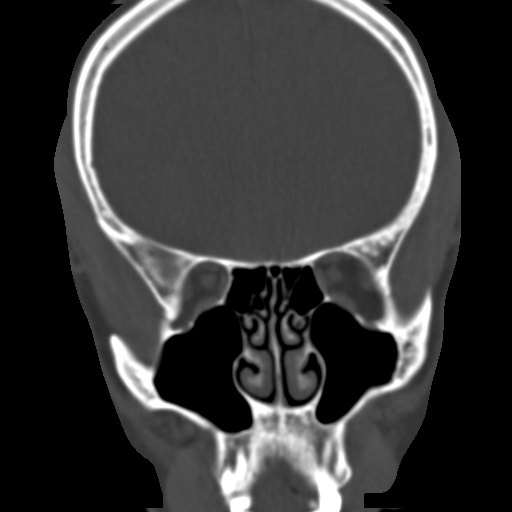
[im 9/18  bone]
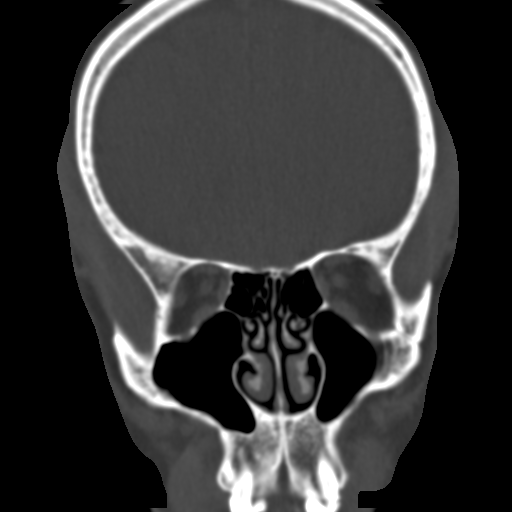
[im 10/18  brain]
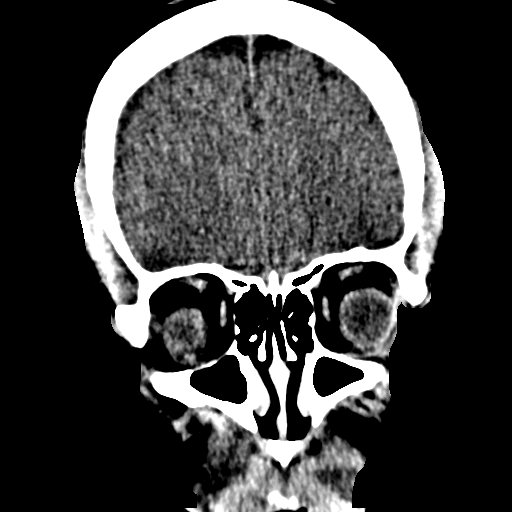
[im 10/18  bone]
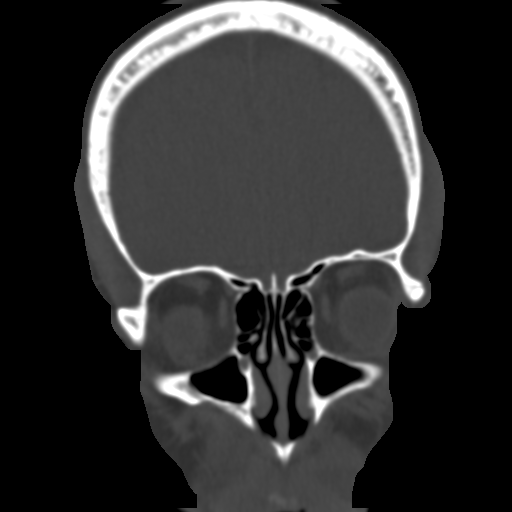
[im 11/18  bone]
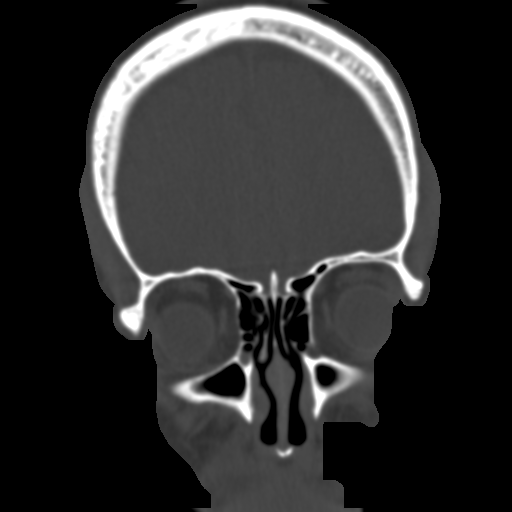
[im 12/18  bone]
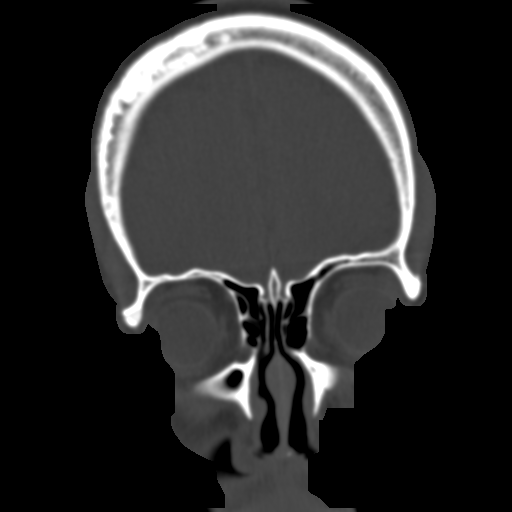
[im 13/18  bone]
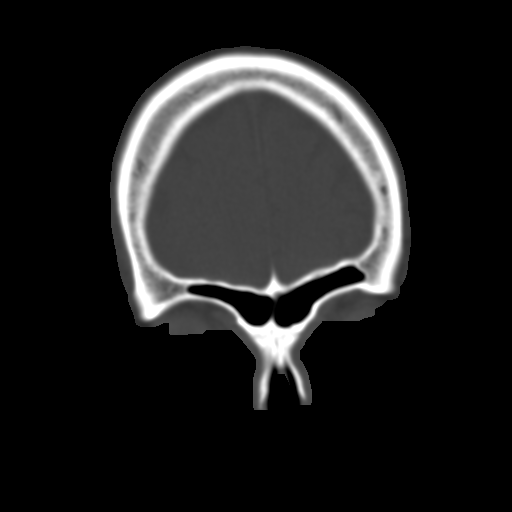
[im 14/18  brain]
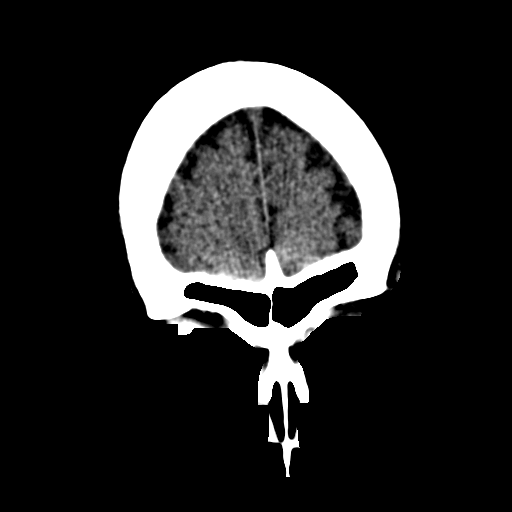
[im 14/18  bone]
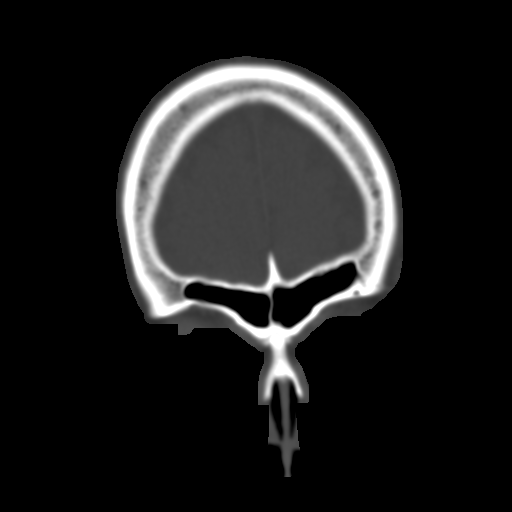
[im 15/18  bone]
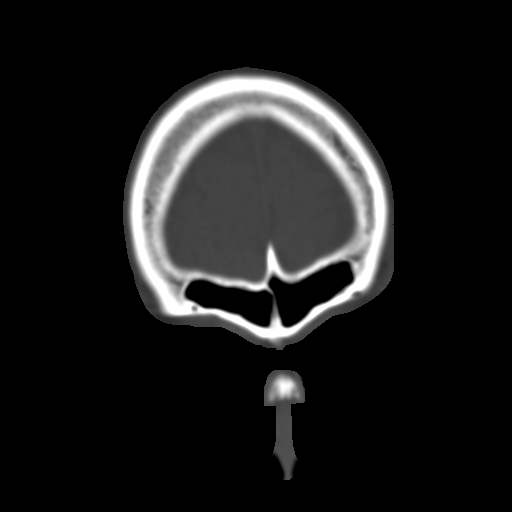
[im 16/18  bone]
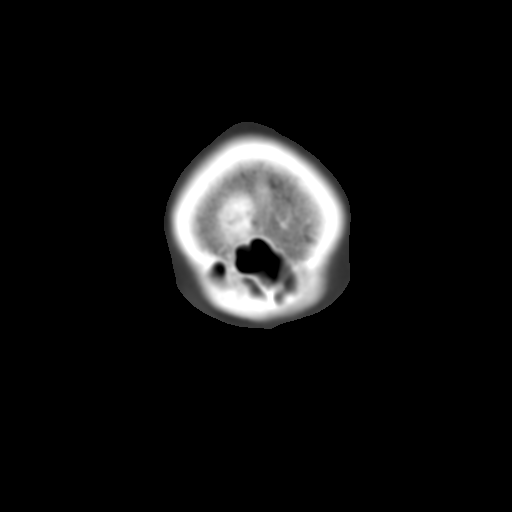
[im 17/18  bone]
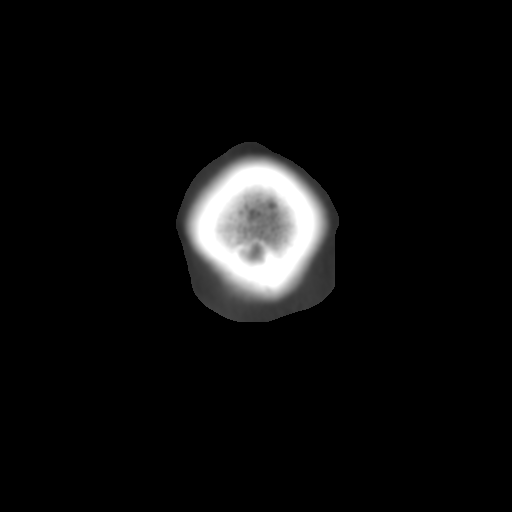

[16 of 19 positions shown; findings below may reference images not displayed]

FINDINGS: Visualized intracranial structures grossly unremarkable.

Osseous mineralization normal.

Nasal septum midline.

No definite sinus opacification or air-fluid levels.

No fracture or bone destruction.
IMPRESSION: No acute abnormalities.

## 2015-09-10 ENCOUNTER — Telehealth: Payer: Self-pay | Admitting: Internal Medicine

## 2015-09-10 NOTE — Telephone Encounter (Signed)
Patient states that he needs a letter stating that the Asthma either causes the insomnia symptoms or worsens the insomnia symptoms.   Patient states that he would like to pick this letter up.  Patient Instructions     Stop advair and start symbicort 80 Take 2 puffs first thing in am and then another 2 puffs about 12 hours later.   Let us know if your drug store won't honor the 25 dollar guarantee and we will have the company call them directly  Add singulair 10 mg daily in evening   Dr Shelle Ironlance and Angelena Solelittman [place] [facility]ing [facility] [place] and if not establish with one of them return here in 3 months - call sooner if needed

## 2015-09-10 NOTE — Telephone Encounter (Signed)
Fine to state that asthma  can worsen sleep disruption or make it difficult to fall asleep.

## 2015-09-10 NOTE — Telephone Encounter (Signed)
Letter printed and signed. Left at front desk for pick up. Pt aware. Nothing further needed.

## 2015-10-18 ENCOUNTER — Ambulatory Visit (INDEPENDENT_AMBULATORY_CARE_PROVIDER_SITE_OTHER): Admitting: Physician Assistant

## 2015-10-18 VITALS — BP 128/84 | HR 83 | Temp 98.6°F | Resp 17 | Ht 70.0 in | Wt 224.0 lb

## 2015-10-18 DIAGNOSIS — J4540 Moderate persistent asthma, uncomplicated: Secondary | ICD-10-CM | POA: Diagnosis not present

## 2015-10-18 DIAGNOSIS — J019 Acute sinusitis, unspecified: Secondary | ICD-10-CM | POA: Diagnosis not present

## 2015-10-18 DIAGNOSIS — R05 Cough: Secondary | ICD-10-CM | POA: Diagnosis not present

## 2015-10-18 DIAGNOSIS — R059 Cough, unspecified: Secondary | ICD-10-CM

## 2015-10-18 MED ORDER — ALBUTEROL SULFATE HFA 108 (90 BASE) MCG/ACT IN AERS: 2.0000 | INHALATION_SPRAY | RESPIRATORY_TRACT | Status: AC | PRN

## 2015-10-18 MED ORDER — HYDROCOD POLST-CPM POLST ER 10-8 MG/5ML PO SUER: 5.0000 mL | Freq: Two times a day (BID) | ORAL | Status: DC | PRN

## 2015-10-18 MED ORDER — AMOXICILLIN-POT CLAVULANATE 875-125 MG PO TABS: 1.0000 | ORAL_TABLET | Freq: Two times a day (BID) | ORAL | Status: AC

## 2015-10-18 NOTE — Patient Instructions (Addendum)
Antibiotic twice a day for 10 days Continue symbicort. Use albuterol as needed for shortness of breath, wheezing, chest tightness Warm compresses on your eyes and good hand hygiene Hot showers, breathing in steam from shower or pot of boiling water, eating spicy food and neti pot with sterile water can all help your sinuses drain. If you are not getting better in 7-10 days, return to clinic     IF you received an x-ray today, you will receive an invoice from [facility]. Please contact [facility] at [phone] with questions or concerns regarding your invoice.   IF you received labwork today, you will receive an invoice from [facility]. Please contact [facility] at [phone] with questions or concerns regarding your invoice.   Our billing staff will not be able to assist you with questions regarding bills from these companies.  You will be contacted with the lab results as soon as they are available. The fastest way to get your results is to activate your My Chart account. Instructions are located on the last page of this paperwork. If you have not heard from us regarding the results in 2 weeks, please contact this office.

## 2015-10-18 NOTE — Progress Notes (Signed)
[facility] [address], [place] [address] [phone]  Date:  10/18/2015   Name:  Jon Powell   DOB:  06/25/1975   MRN:  [id number]  PCP:  No primary care provider on file.    Chief Complaint: Sinus Problem; Cough; and Chest Pain   History of Present Illness:  This is a 40 y.o. male who is presenting with cough and sinus pressure x 1.5 weeks. He is getting some nose bleeds. States he is having irritation in both eyes. Started in the right eye. There is crusting each morning and watery drainage during the day. Eye symptoms have been present x 4 days. Having nasal drainage. Cough is dry. +sob and wheezing, has hx asthma. Some CP with the cough. States "feels like something is stuck". Sore throat and losing his voice. Denies fever, chills.  Aggravating/alleviating factors: mucinex but not working. Delsym and dayquil, also not working. History of asthma: yes -- using symbicort BID. He does not have albuterol at home because ran out. History of env allergies: no Tobacco use: no  Review of Systems:  Review of Systems See HPI  Patient Active Problem List   Diagnosis Date Noted  . 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 Authorizing Provider                budesonide-formoterol (SYMBICORT) 80-4.5 MCG/ACT inhaler Take 2 puffs first thing in am and then another 2 puffs about 12 hours later. Patient not taking: Reported on 10/18/2015 08/28/14  yes Nyoka CowdenMichael B Wert, MD  clobetasol cream (TEMOVATE) 0.05 % Apply 1 application topically 2 (two) times daily. Patient not taking: Reported on 10/18/2015 02/23/14   Sherren MochaEva N Shaw, MD                  Allergies  Allergen Reactions  . Doxycycline Rash    Past Surgical History  Procedure Laterality Date  . No past surgeries      Social History  Substance Use Topics  . Smoking status: Never Smoker   .  Smokeless tobacco: Never Used  . Alcohol Use: 0.6 - 1.2 oz/week    0 Standard drinks or equivalent, 1-2 Glasses of wine per week     Comment: 1-2 drinks weekly    Family History  Problem Relation Age of Onset  . Healthy Mother   . Healthy Father     Medication list has been reviewed and updated.  Physical Examination:  Physical Exam  Constitutional: He is oriented to person, place, and time. He appears well-developed and well-nourished. No distress.  HENT:  Head: Normocephalic and atraumatic.  Right Ear: Hearing, tympanic membrane, external ear and ear canal normal.  Left Ear: Hearing, tympanic membrane, external ear and ear canal normal.  Nose: Right sinus exhibits frontal sinus tenderness. Right sinus exhibits no maxillary sinus tenderness. Left sinus exhibits frontal sinus tenderness. Left sinus exhibits no maxillary sinus tenderness.  Mouth/Throat: Uvula is midline and mucous membranes are normal. Posterior oropharyngeal erythema present. No oropharyngeal exudate or posterior oropharyngeal edema.  Eyes: Lids are normal. Right eye exhibits no discharge. Left eye exhibits no discharge. Right conjunctiva is injected (mild). Left conjunctiva is injected (mild). No scleral icterus.  Cardiovascular: Normal rate, regular rhythm, normal heart sounds and normal pulses.   No murmur heard. Pulmonary/Chest: Effort normal and breath sounds normal. No respiratory distress. He has no  wheezes. He has no rhonchi. He has no rales.  Musculoskeletal: Normal range of motion.  Lymphadenopathy:       Head (right side): No submental, no submandibular and no tonsillar adenopathy present.       Head (left side): No submental, no submandibular and no tonsillar adenopathy present.    He has no cervical adenopathy.  Neurological: He is alert and oriented to person, place, and time.  Skin: Skin is warm, dry and intact. No lesion and no rash noted.  Psychiatric: He has a normal mood and affect. His speech is  normal and behavior is normal. Thought content normal.   BP 128/84 mmHg  Pulse 83  Temp(Src) 98.6 F (37 C) (Oral)  Resp 17  Ht 5\' 10"  (1.778 m)  Wt 224 lb (101.606 kg)  BMI 32.14 kg/m2  SpO2 98%  Assessment and Plan:  1. Acute sinusitis, recurrence not specified, unspecified location 2. Cough 3. Asthma, mod persistent Treat for sinusitis with augmentin. tussionex for cough. Continue symbicort bid -- rx for albuterol sent. Return in 7-10 days if symptoms do not improve or at any time if symptoms worsen.  - amoxicillin-clavulanate (AUGMENTIN) 875-125 MG tablet; Take 1 tablet by mouth 2 (two) times daily.  Dispense: 20 tablet; Refill: 0 - chlorpheniramine-HYDROcodone (TUSSIONEX PENNKINETIC ER) 10-8 MG/5ML SUER; Take 5 mLs by mouth every 12 (twelve) hours as needed for cough.  Dispense: 100 mL; Refill: 0 - albuterol (PROVENTIL HFA;VENTOLIN HFA) 108 (90 Base) MCG/ACT inhaler; Inhale 2 puffs into the lungs every 4 (four) hours as needed for wheezing or shortness of breath (cough, shortness of breath or wheezing.).  Dispense: 1 Inhaler; Refill: 1   Nicole V. Dyke BrackettBush, PA-C, MHS [facility]  10/18/2015

## 2015-10-31 ENCOUNTER — Ambulatory Visit

## 2015-11-02 ENCOUNTER — Ambulatory Visit (INDEPENDENT_AMBULATORY_CARE_PROVIDER_SITE_OTHER): Admitting: Physician Assistant

## 2015-11-02 VITALS — BP 120/74 | HR 104 | Temp 98.4°F | Resp 18 | Ht 70.0 in | Wt 225.6 lb

## 2015-11-02 DIAGNOSIS — J45991 Cough variant asthma: Secondary | ICD-10-CM

## 2015-11-02 DIAGNOSIS — R059 Cough, unspecified: Secondary | ICD-10-CM

## 2015-11-02 DIAGNOSIS — M542 Cervicalgia: Secondary | ICD-10-CM

## 2015-11-02 DIAGNOSIS — R05 Cough: Secondary | ICD-10-CM

## 2015-11-02 MED ORDER — HYDROCOD POLST-CPM POLST ER 10-8 MG/5ML PO SUER: 5.0000 mL | Freq: Two times a day (BID) | ORAL | Status: AC | PRN

## 2015-11-02 NOTE — Progress Notes (Signed)
   Subjective:    Patient ID: Jon Powell, male    DOB: 06/05/1975, 40 y.o.   MRN: [id number]  Chief Complaint  Patient presents with  . Cough    x1 month  . Torticollis    limited ROM x7 years    Medications, allergies, past medical history, surgical history, family history, social history and problem list reviewed and updated.  HPI  [age] yom presents with above complaints.   Persisent cough for several years. Has had normal xrs in past. Referred to pulm last year who diagnosed him with cough variant asthma. Started on symbicort. Takes as prescribed and keeps cough at bay. Was seen in our clinic 2 wks ago with recent worsening in cough. Diagnosed with sinusitis and treated with augmentin and tussionex. States this resolved the cough. Is back asking for one refill of tussionex as it helped with the cough at night.   Denies fevers, chills, cp. Cough is not productive. Has not been taking any flonase for allergies. Takes occasional anti histamine. Denies night sweats, unintentional wt loss, dipahoresis.   Also with persistent neck stiffness. Would like referral to ortho today for testing he needs to have done per the [employer]. [facility]   Review of Systems Denies abd pain, n/v, diarrhea, otalgia, sore throat.     Objective:   Physical Exam  Constitutional: He appears well-developed and well-nourished.  Non-toxic appearance. He does not have a sickly appearance. He does not appear ill. No distress.  BP 120/74 mmHg  Pulse 104  Temp(Src) 98.4 F (36.9 C) (Oral)  Resp 18  Ht 5\' 10"  (1.778 m)  Wt 225 lb 9.6 oz (102.331 kg)  BMI 32.37 kg/m2  SpO2 98%   HENT:  Right Ear: Tympanic membrane normal.  Left Ear: Tympanic membrane normal.  Nose: Mucosal edema and rhinorrhea present. Right sinus exhibits no maxillary sinus tenderness and no frontal sinus tenderness. Left sinus exhibits no maxillary sinus tenderness and no frontal sinus tenderness.  Mouth/Throat: Uvula is midline, oropharynx  is clear and moist and mucous membranes are normal.  Pulmonary/Chest: Effort normal and breath sounds normal. No tachypnea.  Lymphadenopathy:       Head (right side): No submental, no submandibular and no tonsillar adenopathy present.       Head (left side): No submental, no submandibular and no tonsillar adenopathy present.    He has no cervical adenopathy.      Assessment & Plan:   Cough variant asthma  Neck pain - Plan: Ambulatory referral to Orthopedic Surgery  Cough - Plan: chlorpheniramine-HYDROcodone (TUSSIONEX PENNKINETIC ER) 10-8 MG/5ML SUER --refilled tussionex for cough --encouraged pt to limit tussionex prn, pt agreeable, no refills given  --suspect both cough variant asthma (continue symbicort) and allergic rhinitis (start flonase, continue anti histamine) contributing to cough --[place] to [place], encouraged to touch base with allergist once he moves there per recent pulmonary recommendation last year  --referral to ortho for neck pain per pt request   Donnajean Lopesodd M. Coby Antrobus, PA-C Physician Assistant-Certified [facility]  11/02/2015 4:58 PM

## 2015-11-02 NOTE — Patient Instructions (Addendum)
I've refilled the tussionex for your cough. The cough is likely secondary to your asthma. Continue your symbicort as prescribed and I recommend establishing with an allergist when you move to [place].  The cough is also likely secondary to allergies. Recommend starting flonase nasal spray 2 sprays in each nostril twice daily. It is best to limit the tussionex for times when you can't sleep so that you don't become dependent on this.  I've referred you to ortho for your neck pain per your request.
